# Patient Record
Sex: Female | Born: 1985 | Race: Black or African American | Hispanic: No | Marital: Single | Smoking: Never smoker
Health system: Southern US, Community
[De-identification: ages and names within clinical notes are randomized; demographics above are authoritative.]

## PROBLEM LIST (undated history)

## (undated) DIAGNOSIS — G43009 Migraine without aura, not intractable, without status migrainosus: Secondary | ICD-10-CM

---
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
## 2006-08-29 ENCOUNTER — Emergency Department (HOSPITAL_COMMUNITY): Admission: EM | Admit: 2006-08-29 | Discharge: 2006-08-29 | Payer: Self-pay | Admitting: Emergency Medicine

## 2009-06-20 ENCOUNTER — Emergency Department (HOSPITAL_COMMUNITY): Admission: EM | Admit: 2009-06-20 | Discharge: 2009-06-20 | Payer: Self-pay | Admitting: Emergency Medicine

## 2010-09-06 ENCOUNTER — Inpatient Hospital Stay (HOSPITAL_COMMUNITY)
Admission: AD | Admit: 2010-09-06 | Discharge: 2010-09-09 | Payer: Self-pay | Source: Home / Self Care | Attending: Obstetrics and Gynecology | Admitting: Obstetrics and Gynecology

## 2010-11-22 LAB — CBC
HCT: 30.5 % — ABNORMAL LOW (ref 36.0–46.0)
HCT: 37.2 % (ref 36.0–46.0)
Hemoglobin: 13.1 g/dL (ref 12.0–15.0)
MCH: 32.4 pg (ref 26.0–34.0)
MCHC: 34.4 g/dL (ref 30.0–36.0)
MCHC: 35.2 g/dL (ref 30.0–36.0)
Platelets: 209 10*3/uL (ref 150–400)
RBC: 4.02 MIL/uL (ref 3.87–5.11)
RDW: 13 % (ref 11.5–15.5)
WBC: 11.6 10*3/uL — ABNORMAL HIGH (ref 4.0–10.5)

## 2011-05-11 ENCOUNTER — Emergency Department (HOSPITAL_COMMUNITY)
Admission: EM | Admit: 2011-05-11 | Discharge: 2011-05-11 | Discharge status: Against Medical Advice | Payer: Medicaid Other | Attending: Emergency Medicine | Admitting: Emergency Medicine

## 2011-05-11 DIAGNOSIS — Z0489 Encounter for examination and observation for other specified reasons: Secondary | ICD-10-CM | POA: Insufficient documentation

## 2014-12-22 ENCOUNTER — Ambulatory Visit (INDEPENDENT_AMBULATORY_CARE_PROVIDER_SITE_OTHER): Payer: 59 | Admitting: Urgent Care

## 2014-12-22 ENCOUNTER — Encounter: Payer: Self-pay | Admitting: Urgent Care

## 2014-12-22 ENCOUNTER — Telehealth: Payer: Self-pay

## 2014-12-22 ENCOUNTER — Ambulatory Visit: Payer: Self-pay | Admitting: Urgent Care

## 2014-12-22 ENCOUNTER — Ambulatory Visit: Payer: Self-pay | Admitting: Family Medicine

## 2014-12-22 VITALS — BP 121/73 | HR 89 | Temp 98.2°F | Resp 16 | Ht 66.0 in | Wt 166.0 lb

## 2014-12-22 DIAGNOSIS — L309 Dermatitis, unspecified: Secondary | ICD-10-CM

## 2014-12-22 DIAGNOSIS — M79641 Pain in right hand: Secondary | ICD-10-CM

## 2014-12-22 MED ORDER — TRIAMCINOLONE 0.1 % CREAM:EUCERIN CREAM 1:1: 1.0000 "application " | TOPICAL_CREAM | Freq: Two times a day (BID) | CUTANEOUS | Status: DC

## 2014-12-22 MED ORDER — TRIAMCINOLONE ACETONIDE 0.1 % EX OINT: 1.0000 "application " | TOPICAL_OINTMENT | Freq: Two times a day (BID) | CUTANEOUS | Status: DC

## 2014-12-22 MED ORDER — LIDOCAINE (ANORECTAL) 5 % EX CREA: 1.0000 "application " | TOPICAL_CREAM | Freq: Two times a day (BID) | CUTANEOUS | Status: DC | PRN

## 2014-12-22 NOTE — Telephone Encounter (Signed)
Called patient's pharmacy. They confirmed that she will be getting triamcinilone ointment for eczema. Called patient and let her know.  Wallis BambergMario Bryelle Spiewak, PA-C [facility] [phone] 12/22/2014  6:05 PM

## 2014-12-22 NOTE — Patient Instructions (Signed)
Eczema Eczema, also called atopic dermatitis, is a skin disorder that causes inflammation of the skin. It causes a red rash and dry, scaly skin. The skin becomes very itchy. Eczema is generally worse during the cooler winter months and often improves with the warmth of summer. Eczema usually starts showing signs in infancy. Some children outgrow eczema, but it may last through adulthood.  CAUSES  The exact cause of eczema is not known, but it appears to run in families. People with eczema often have a family history of eczema, allergies, asthma, or hay fever. Eczema is not contagious. Flare-ups of the condition may be caused by:   Contact with something you are sensitive or allergic to.   Stress. SIGNS AND SYMPTOMS  Dry, scaly skin.   Red, itchy rash.   Itchiness. This may occur before the skin rash and may be very intense.  DIAGNOSIS  The diagnosis of eczema is usually made based on symptoms and medical history. TREATMENT  Eczema cannot be cured, but symptoms usually can be controlled with treatment and other strategies. A treatment plan might include:  Controlling the itching and scratching.   Use over-the-counter antihistamines as directed for itching. This is especially useful at night when the itching tends to be worse.   Use over-the-counter steroid creams as directed for itching.   Avoid scratching. Scratching makes the rash and itching worse. It may also result in a skin infection (impetigo) due to a break in the skin caused by scratching.   Keeping the skin well moisturized with creams every day. This will seal in moisture and help prevent dryness. Lotions that contain alcohol and water should be avoided because they can dry the skin.   Limiting exposure to things that you are sensitive or allergic to (allergens).   Recognizing situations that cause stress.   Developing a plan to manage stress.  HOME CARE INSTRUCTIONS   Only take over-the-counter or  prescription medicines as directed by your health care provider.   Do not use anything on the skin without checking with your health care provider.   Keep baths or showers short (5 minutes) in warm (not hot) water. Use mild cleansers for bathing. These should be unscented. You may add nonperfumed bath oil to the bath water. It is best to avoid soap and bubble bath.   Immediately after a bath or shower, when the skin is still damp, apply a moisturizing ointment to the entire body. This ointment should be a petroleum ointment. This will seal in moisture and help prevent dryness. The thicker the ointment, the better. These should be unscented.   Keep fingernails cut short. Children with eczema may need to wear soft gloves or mittens at night after applying an ointment.   Dress in clothes made of cotton or cotton blends. Dress lightly, because heat increases itching.   A child with eczema should stay away from anyone with fever blisters or cold sores. The virus that causes fever blisters (herpes simplex) can cause a serious skin infection in children with eczema. SEEK MEDICAL CARE IF:   Your itching interferes with sleep.   Your rash gets worse or is not better within 1 week after starting treatment.   You see pus or soft yellow scabs in the rash area.   You have a fever.   You have a rash flare-up after contact with someone who has fever blisters.  Document Released: 08/26/2000 Document Revised: 06/19/2013 Document Reviewed: 04/01/2013 [employer] Patient Information 2015 [employer]. This information   is not intended to replace advice given to you by your health care provider. Make sure you discuss any questions you have with your health care provider.  

## 2014-12-22 NOTE — Telephone Encounter (Signed)
Patient states she was to get an onitment not the cream.    [phone]

## 2014-12-22 NOTE — Progress Notes (Signed)
    MRN: [id number] DOB: 06/16/1986  Subjective:   Penny Kelly is a 29 y.o. female presenting for chief complaint of Eczema  Reports ~1 week history of dry, scaly skin rash over right forearm. Has an extensive history of intermittent eczema. Usually this has showed up in her right lower leg over extensor surface. Has used triamcinolone with significant relief in the past. Would like a refill of this medication. 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 Washes hands up to forearms frequently. Has a 1-2 year history of bilateral hand pain R>>L, hand tingling in right hand, feels cramping sensation in right hand as well. Now is having this in her forearm as well. She has previously tried lidocaine 5% with significant relief. Denies weakness, fevers, bony pain, loss of ROM, previous hand surgery, history of diabetes, history of carpal tunnel syndrome. Denies any other aggravating or relieving factors, no other questions or concerns.  Penny Kelly currently has no medications in their medication list. She has No Known Allergies.  Penny Kelly  has no past medical history on file. Also  has no past surgical history on file.  ROS As in subjective.  Objective:   Vitals: BP 121/73 mmHg  Pulse 89  Temp(Src) 98.2 F (36.8 C) (Oral)  Resp 16  Ht 5\' 6"  (1.676 m)  Wt 166 lb (75.297 kg)  BMI 26.81 kg/m2  SpO2 99%  LMP 12/15/2014  Physical Exam  Constitutional: She is well-developed, well-nourished, and in no distress.  Cardiovascular: Normal rate.   Pulmonary/Chest: Effort normal.  Musculoskeletal:       Right elbow: She exhibits normal range of motion, no swelling, no effusion, no deformity and no laceration. No tenderness found.       Right wrist: She exhibits normal range of motion, no tenderness, no bony tenderness, no swelling, no effusion, no crepitus, no deformity and no laceration.  Negative Tinnels and Phalens.  Skin: Skin is warm and dry. Rash  noted. No erythema. No pallor.      Assessment and Plan :   1. Eczema - Refilled triamcinolone ointment for patient, advised to apply pea-size amount to affected area twice daily for 1 week, return to clinic if no improvement in rash, consider infectious etiology  2. Pain of right hand - Refilled lidocaine 5%, counseled patient that this may be carpal tunnel syndrome and should undergo trial of wrist splint at night and/or during work, advised RICE, patient will continue to work, consider referral to PT or ortho for further evaluation if patient does not experience improvement in symptoms in 2-4 weeks. Patient agreed.  Wallis BambergMario Mathis Cashman, PA-C [facility] [phone] 12/22/2014 2:59 PM

## 2015-04-23 ENCOUNTER — Encounter (HOSPITAL_BASED_OUTPATIENT_CLINIC_OR_DEPARTMENT_OTHER): Payer: Self-pay | Admitting: *Deleted

## 2015-04-23 ENCOUNTER — Ambulatory Visit (INDEPENDENT_AMBULATORY_CARE_PROVIDER_SITE_OTHER): Payer: 59

## 2015-04-23 ENCOUNTER — Ambulatory Visit (INDEPENDENT_AMBULATORY_CARE_PROVIDER_SITE_OTHER): Payer: 59 | Admitting: Family Medicine

## 2015-04-23 VITALS — BP 128/70 | HR 64 | Temp 98.1°F | Resp 15 | Ht 65.5 in | Wt 158.0 lb

## 2015-04-23 DIAGNOSIS — Y998 Other external cause status: Secondary | ICD-10-CM | POA: Insufficient documentation

## 2015-04-23 DIAGNOSIS — S0990XA Unspecified injury of head, initial encounter: Secondary | ICD-10-CM | POA: Insufficient documentation

## 2015-04-23 DIAGNOSIS — S060X0A Concussion without loss of consciousness, initial encounter: Secondary | ICD-10-CM | POA: Diagnosis not present

## 2015-04-23 DIAGNOSIS — Y9389 Activity, other specified: Secondary | ICD-10-CM | POA: Insufficient documentation

## 2015-04-23 DIAGNOSIS — G44319 Acute post-traumatic headache, not intractable: Secondary | ICD-10-CM | POA: Diagnosis not present

## 2015-04-23 DIAGNOSIS — Y9289 Other specified places as the place of occurrence of the external cause: Secondary | ICD-10-CM | POA: Insufficient documentation

## 2015-04-23 DIAGNOSIS — S161XXA Strain of muscle, fascia and tendon at neck level, initial encounter: Secondary | ICD-10-CM | POA: Diagnosis not present

## 2015-04-23 DIAGNOSIS — M542 Cervicalgia: Secondary | ICD-10-CM | POA: Diagnosis not present

## 2015-04-23 DIAGNOSIS — W1839XA Other fall on same level, initial encounter: Secondary | ICD-10-CM | POA: Diagnosis not present

## 2015-04-23 NOTE — ED Notes (Addendum)
Another person fell on top of her head with his elbow at [facility] yesterday.  She was seen this am at [facility]. States her pain is getting worse. She feels she needs a CT. Alert oriented. She drove herself here. She is eating without vomiting.

## 2015-04-23 NOTE — Patient Instructions (Addendum)
Your x-rays of your neck appear overall okay, do not see any fracture. I suspect you strained the muscles at the base of the head and neck. See information below on this condition. If your pain is worsening  - recommend repeat evaluation here or the emergency room to determine if further imaging needed.  Based on your exam today, I do not see an acute need for CT scan of the brain. However if your headache worsens or any new or worsening symptoms as we discussed, proceed to the emergency room to determine if CT scanning needed that time. See precautions below. For medications at this time I would recommend Tylenol only as other medicines may cause side effects that could interfere with monitoring for worsening signs of concussion.    You can apply cool compress or heat to the area of soreness on your back , and neck as needed.  Return to the clinic or go to the nearest emergency room if any of your symptoms worsen or new symptoms occur.   HEAD INJURY If any of the following occur notify your physician or go to the Hospital Emergency Department: . Increased drowsiness, stupor or loss of consciousness . Restlessness or convulsions (fits) . Paralysis in arms or legs . Temperature above 100 F . Vomiting . Severe headache . Blood or clear fluid dripping from the nose or ears . Stiffness of the neck . Dizziness or blurred vision . Pulsating pain in the eye . Unequal pupils of eye . Personality changes . Any other unusual symptoms PRECAUTIONS . Keep head elevated at all times for the first 24 hours (Elevate mattress if pillow is ineffective) . Do not take tranquilizers, sedatives, narcotics or alcohol . Avoid aspirin. Use only acetaminophen (e.g. Tylenol) or ibuprofen (e.g. Advil) for relief of pain. Follow directions on the bottle for dosage. . Use ice packs for comfort MEDICATIONS Use medications only as directed by your physician  Concussion A concussion, or closed-head injury, is a  brain injury caused by a direct blow to the head or by a quick and sudden movement (jolt) of the head or neck. Concussions are usually not life-threatening. Even so, the effects of a concussion can be serious. If you have had a concussion before, you are more likely to experience concussion-like symptoms after a direct blow to the head.  CAUSES  Direct blow to the head, such as from running into another player during a soccer game, being hit in a fight, or hitting your head on a hard surface.  A jolt of the head or neck that causes the brain to move back and forth inside the skull, such as in a car crash. SIGNS AND SYMPTOMS The signs of a concussion can be hard to notice. Early on, they may be missed by you, family members, and health care providers. You may look fine but act or feel differently. Symptoms are usually temporary, but they may last for days, weeks, or even longer. Some symptoms may appear right away while others may not show up for hours or days. Every head injury is different. Symptoms include:  Mild to moderate headaches that will not go away.  A feeling of pressure inside your head.  Having more trouble than usual:  Learning or remembering things you have heard.  Answering questions.  Paying attention or concentrating.  Organizing daily tasks.  Making decisions and solving problems.  Slowness in thinking, acting or reacting, speaking, or reading.  Getting lost or being easily confused.  Feeling  tired all the time or lacking energy (fatigued).  Feeling drowsy.  Sleep disturbances.  Sleeping more than usual.  Sleeping less than usual.  Trouble falling asleep.  Trouble sleeping (insomnia).  Loss of balance or feeling lightheaded or dizzy.  Nausea or vomiting.  Numbness or tingling.  Increased sensitivity to:  Sounds.  Lights.  Distractions.  Vision problems or eyes that tire easily.  Diminished sense of taste or smell.  Ringing in the  ears.  Mood changes such as feeling sad or anxious.  Becoming easily irritated or angry for little or no reason.  Lack of motivation.  Seeing or hearing things other people do not see or hear (hallucinations). DIAGNOSIS Your health care provider can usually diagnose a concussion based on a description of your injury and symptoms. He or she will ask whether you passed out (lost consciousness) and whether you are having trouble remembering events that happened right before and during your injury. Your evaluation might include:  A brain scan to look for signs of injury to the brain. Even if the test shows no injury, you may still have a concussion.  Blood tests to be sure other problems are not present. TREATMENT  Concussions are usually treated in an emergency department, in urgent care, or at a clinic. You may need to stay in the hospital overnight for further treatment.  Tell your health care provider if you are taking any medicines, including prescription medicines, over-the-counter medicines, and natural remedies. Some medicines, such as blood thinners (anticoagulants) and aspirin, may increase the chance of complications. Also tell your health care provider whether you have had alcohol or are taking illegal drugs. This information may affect treatment.  Your health care provider will send you home with important instructions to follow.  How fast you will recover from a concussion depends on many factors. These factors include how severe your concussion is, what part of your brain was injured, your age, and how healthy you were before the concussion.  Most people with mild injuries recover fully. Recovery can take time. In general, recovery is slower in older persons. Also, persons who have had a concussion in the past or have other medical problems may find that it takes longer to recover from their current injury. HOME CARE INSTRUCTIONS General Instructions  Carefully follow the  directions your health care provider gave you.  Only take over-the-counter or prescription medicines for pain, discomfort, or fever as directed by your health care provider.  Take only those medicines that your health care provider has approved.  Do not drink alcohol until your health care provider says you are well enough to do so. Alcohol and certain other drugs may slow your recovery and can put you at risk of further injury.  If it is harder than usual to remember things, write them down.  If you are easily distracted, try to do one thing at a time. For example, do not try to watch TV while fixing dinner.  Talk with family members or close friends when making important decisions.  Keep all follow-up appointments. Repeated evaluation of your symptoms is recommended for your recovery.  Watch your symptoms and tell others to do the same. Complications sometimes occur after a concussion. Older adults with a brain injury may have a higher risk of serious complications, such as a blood clot on the brain.  Tell your teachers, school nurse, school counselor, coach, athletic trainer, or work Production designer, theatre/television/film about your injury, symptoms, and restrictions. Tell them about what  you can or cannot do. They should watch for:  Increased problems with attention or concentration.  Increased difficulty remembering or learning new information.  Increased time needed to complete tasks or assignments.  Increased irritability or decreased ability to cope with stress.  Increased symptoms.  Rest. Rest helps the brain to heal. Make sure you:  Get plenty of sleep at night. Avoid staying up late at night.  Keep the same bedtime hours on weekends and weekdays.  Rest during the day. Take daytime naps or rest breaks when you feel tired.  Limit activities that require a lot of thought or concentration. These include:  Doing homework or job-related work.  Watching TV.  Working on the computer.  Avoid any  situation where there is potential for another head injury (football, hockey, soccer, basketball, martial arts, downhill snow sports and horseback riding). Your condition will get worse every time you experience a concussion. You should avoid these activities until you are evaluated by the appropriate follow-up health care providers. Returning To Your Regular Activities You will need to return to your normal activities slowly, not all at once. You must give your body and brain enough time for recovery.  Do not return to sports or other athletic activities until your health care provider tells you it is safe to do so.  Ask your health care provider when you can drive, ride a bicycle, or operate heavy machinery. Your ability to react may be slower after a brain injury. Never do these activities if you are dizzy.  Ask your health care provider about when you can return to work or school. Preventing Another Concussion It is very important to avoid another brain injury, especially before you have recovered. In rare cases, another injury can lead to permanent brain damage, brain swelling, or death. The risk of this is greatest during the first 7-10 days after a head injury. Avoid injuries by:  Wearing a seat belt when riding in a car.  Drinking alcohol only in moderation.  Wearing a helmet when biking, skiing, skateboarding, skating, or doing similar activities.  Avoiding activities that could lead to a second concussion, such as contact or recreational sports, until your health care provider says it is okay.  Taking safety measures in your home.  Remove clutter and tripping hazards from floors and stairways.  Use grab bars in bathrooms and handrails by stairs.  Place non-slip mats on floors and in bathtubs.  Improve lighting in dim areas. SEEK MEDICAL CARE IF:  You have increased problems paying attention or concentrating.  You have increased difficulty remembering or learning new  information.  You need more time to complete tasks or assignments than before.  You have increased irritability or decreased ability to cope with stress.  You have more symptoms than before. Seek medical care if you have any of the following symptoms for more than 2 weeks after your injury:  Lasting (chronic) headaches.  Dizziness or balance problems.  Nausea.  Vision problems.  Increased sensitivity to noise or light.  Depression or mood swings.  Anxiety or irritability.  Memory problems.  Difficulty concentrating or paying attention.  Sleep problems.  Feeling tired all the time. SEEK IMMEDIATE MEDICAL CARE IF:  You have severe or worsening headaches. These may be a sign of a blood clot in the brain.  You have weakness (even if only in one hand, leg, or part of the face).  You have numbness.  You have decreased coordination.  You vomit repeatedly.  You have increased sleepiness.  One pupil is larger than the other.  You have convulsions.  You have slurred speech.  You have increased confusion. This may be a sign of a blood clot in the brain.  You have increased restlessness, agitation, or irritability.  You are unable to recognize people or places.  You have neck pain.  It is difficult to wake you up.  You have unusual behavior changes.  You lose consciousness. MAKE SURE YOU:  Understand these instructions.  Will watch your condition.  Will get help right away if you are not doing well or get worse. Document Released: 11/19/2003 Document Revised: 09/03/2013 Document Reviewed: 03/21/2013 [facility] Patient Information 2015 [place], [place]. This information is not intended to replace advice given to you by your health care provider. Make sure you discuss any questions you have with your health care provider.  Cervical Sprain A cervical sprain is an injury in the neck in which the strong, fibrous tissues (ligaments) that connect your neck bones  stretch or tear. Cervical sprains can range from mild to severe. Severe cervical sprains can cause the neck vertebrae to be unstable. This can lead to damage of the spinal cord and can result in serious nervous system problems. The amount of time it takes for a cervical sprain to get better depends on the cause and extent of the injury. Most cervical sprains heal in 1 to 3 weeks. CAUSES  Severe cervical sprains may be caused by:   Contact sport injuries (such as from football, rugby, wrestling, hockey, auto racing, gymnastics, diving, martial arts, or boxing).   Motor vehicle collisions.   Whiplash injuries. This is an injury from a sudden forward and backward whipping movement of the head and neck.  Falls.  Mild cervical sprains may be caused by:   Being in an awkward position, such as while cradling a telephone between your ear and shoulder.   Sitting in a chair that does not offer proper support.   Working at a poorly Marketing executive station.   Looking up or down for long periods of time.  SYMPTOMS   Pain, soreness, stiffness, or a burning sensation in the front, back, or sides of the neck. This discomfort may develop immediately after the injury or slowly, 24 hours or more after the injury.   Pain or tenderness directly in the middle of the back of the neck.   Shoulder or upper back pain.   Limited ability to move the neck.   Headache.   Dizziness.   Weakness, numbness, or tingling in the hands or arms.   Muscle spasms.   Difficulty swallowing or chewing.   Tenderness and swelling of the neck.  DIAGNOSIS  Most of the time your health care provider can diagnose a cervical sprain by taking your history and doing a physical exam. Your health care provider will ask about previous neck injuries and any known neck problems, such as arthritis in the neck. X-rays may be taken to find out if there are any other problems, such as with the bones of the neck.  Other tests, such as a CT scan or MRI, may also be needed.  TREATMENT  Treatment depends on the severity of the cervical sprain. Mild sprains can be treated with rest, keeping the neck in place (immobilization), and pain medicines. Severe cervical sprains are immediately immobilized. Further treatment is done to help with pain, muscle spasms, and other symptoms and may include:  Medicines, such as pain relievers, numbing medicines,  or muscle relaxants.   Physical therapy. This may involve stretching exercises, strengthening exercises, and posture training. Exercises and improved posture can help stabilize the neck, strengthen muscles, and help stop symptoms from returning.  HOME CARE INSTRUCTIONS   Put ice on the injured area.   Put ice in a plastic bag.   Place a towel between your skin and the bag.   Leave the ice on for 15-20 minutes, 3-4 times a day.   If your injury was severe, you may have been given a cervical collar to wear. A cervical collar is a two-piece collar designed to keep your neck from moving while it heals.  Do not remove the collar unless instructed by your health care provider.  If you have long hair, keep it outside of the collar.  Ask your health care provider before making any adjustments to your collar. Minor adjustments may be required over time to improve comfort and reduce pressure on your chin or on the back of your head.  Ifyou are allowed to remove the collar for cleaning or bathing, follow your health care provider's instructions on how to do so safely.  Keep your collar clean by wiping it with mild soap and water and drying it completely. If the collar you have been given includes removable pads, remove them every 1-2 days and hand wash them with soap and water. Allow them to air dry. They should be completely dry before you wear them in the collar.  If you are allowed to remove the collar for cleaning and bathing, wash and dry the skin of your  neck. Check your skin for irritation or sores. If you see any, tell your health care provider.  Do not drive while wearing the collar.   Only take over-the-counter or prescription medicines for pain, discomfort, or fever as directed by your health care provider.   Keep all follow-up appointments as directed by your health care provider.   Keep all physical therapy appointments as directed by your health care provider.   Make any needed adjustments to your workstation to promote good posture.   Avoid positions and activities that make your symptoms worse.   Warm up and stretch before being active to help prevent problems.  SEEK MEDICAL CARE IF:   Your pain is not controlled with medicine.   You are unable to decrease your pain medicine over time as planned.   Your activity level is not improving as expected.  SEEK IMMEDIATE MEDICAL CARE IF:   You develop any bleeding.  You develop stomach upset.  You have signs of an allergic reaction to your medicine.   Your symptoms get worse.   You develop new, unexplained symptoms.   You have numbness, tingling, weakness, or paralysis in any part of your body.  MAKE SURE YOU:   Understand these instructions.  Will watch your condition.  Will get help right away if you are not doing well or get worse. Document Released: 06/26/2007 Document Revised: 09/03/2013 Document Reviewed: 03/06/2013 [facility] Patient Information 2015 [place], [place]. This information is not intended to replace advice given to you by your health care provider. Make sure you discuss any questions you have with your health care provider.
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
## 2015-04-24 ENCOUNTER — Emergency Department (HOSPITAL_BASED_OUTPATIENT_CLINIC_OR_DEPARTMENT_OTHER)
Admission: EM | Admit: 2015-04-24 | Discharge: 2015-04-24 | Discharge status: Against Medical Advice | Payer: 59 | Attending: Emergency Medicine | Admitting: Emergency Medicine

## 2015-04-24 ENCOUNTER — Telehealth: Payer: Self-pay

## 2015-04-24 ENCOUNTER — Encounter (HOSPITAL_COMMUNITY): Payer: Self-pay | Admitting: *Deleted

## 2015-04-24 DIAGNOSIS — R42 Dizziness and giddiness: Secondary | ICD-10-CM | POA: Diagnosis not present

## 2015-04-24 DIAGNOSIS — R51 Headache: Secondary | ICD-10-CM | POA: Insufficient documentation

## 2015-04-24 DIAGNOSIS — R110 Nausea: Secondary | ICD-10-CM | POA: Diagnosis not present

## 2015-04-24 DIAGNOSIS — Z8679 Personal history of other diseases of the circulatory system: Secondary | ICD-10-CM | POA: Diagnosis not present
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
## 2015-04-24 NOTE — Telephone Encounter (Signed)
Penny Kelly spoke with Dr. Neva Seat on yesterday about a CT scan. She wants him to go ahead and order that for her please & get back in contact with her with all the details.

## 2015-04-25 ENCOUNTER — Emergency Department (HOSPITAL_COMMUNITY)
Admission: EM | Admit: 2015-04-25 | Discharge: 2015-04-25 | Disposition: A | Discharge status: Home / Self Care | Payer: 59 | Attending: Emergency Medicine | Admitting: Emergency Medicine

## 2015-04-25 ENCOUNTER — Emergency Department (HOSPITAL_COMMUNITY): Payer: 59

## 2015-04-25 DIAGNOSIS — R519 Headache, unspecified: Secondary | ICD-10-CM

## 2015-04-25 DIAGNOSIS — R51 Headache: Secondary | ICD-10-CM

## 2015-04-25 HISTORY — DX: Migraine without aura, not intractable, without status migrainosus: G43.009

## 2015-04-25 MED ORDER — METOCLOPRAMIDE HCL 5 MG/ML IJ SOLN
10.0000 mg | Freq: Once | INTRAMUSCULAR | Status: AC
  Administered 2015-04-25: 10 mg via INTRAVENOUS
  Filled 2015-04-25: qty 2

## 2015-04-25 MED ORDER — DIPHENHYDRAMINE HCL 50 MG/ML IJ SOLN
25.0000 mg | Freq: Once | INTRAMUSCULAR | Status: AC
  Administered 2015-04-25: 25 mg via INTRAVENOUS
  Filled 2015-04-25: qty 1

## 2015-04-25 MED ORDER — KETOROLAC TROMETHAMINE 30 MG/ML IJ SOLN
30.0000 mg | Freq: Once | INTRAMUSCULAR | Status: AC
  Administered 2015-04-25: 30 mg via INTRAVENOUS
  Filled 2015-04-25: qty 1

## 2015-04-25 MED ORDER — METOCLOPRAMIDE HCL 10 MG PO TABS: 10.0000 mg | ORAL_TABLET | Freq: Four times a day (QID) | ORAL | Status: DC | PRN

## 2015-04-25 MED ORDER — SODIUM CHLORIDE 0.9 % IV SOLN
1000.0000 mL | Freq: Once | INTRAVENOUS | Status: AC
  Administered 2015-04-25: 1000 mL via INTRAVENOUS

## 2015-04-25 MED ORDER — SODIUM CHLORIDE 0.9 % IV SOLN
1000.0000 mL | INTRAVENOUS | Status: DC
  Administered 2015-04-25: 1000 mL via INTRAVENOUS

## 2015-04-25 NOTE — Discharge Instructions (Signed)
Take ibuprofen, naproxen, or acetaminophen as needed for pain.  General Headache Without Cause A headache is pain or discomfort felt around the head or neck area. The specific cause of a headache may not be found. There are many causes and types of headaches. A few common ones are:  Tension headaches.  Migraine headaches.  Cluster headaches.  Chronic daily headaches. HOME CARE INSTRUCTIONS   Keep all follow-up appointments with your caregiver or any specialist referral.  Only take over-the-counter or prescription medicines for pain or discomfort as directed by your caregiver.  Lie down in a dark, quiet room when you have a headache.  Keep a headache journal to find out what may trigger your migraine headaches. For example, write down:  What you eat and drink.  How much sleep you get.  Any change to your diet or medicines.  Try massage or other relaxation techniques.  Put ice packs or heat on the head and neck. Use these 3 to 4 times per day for 15 to 20 minutes each time, or as needed.  Limit stress.  Sit up straight, and do not tense your muscles.  Quit smoking if you smoke.  Limit alcohol use.  Decrease the amount of caffeine you drink, or stop drinking caffeine.  Eat and sleep on a regular schedule.  Get 7 to 9 hours of sleep, or as recommended by your caregiver.  Keep lights dim if bright lights bother you and make your headaches worse. SEEK MEDICAL CARE IF:   You have problems with the medicines you were prescribed.  Your medicines are not working.  You have a change from the usual headache.  You have nausea or vomiting. SEEK IMMEDIATE MEDICAL CARE IF:   Your headache becomes severe.  You have a fever.  You have a stiff neck.  You have loss of vision.  You have muscular weakness or loss of muscle control.  You start losing your balance or have trouble walking.  You feel faint or pass out.  You have severe symptoms that are different from  your first symptoms. MAKE SURE YOU:   Understand these instructions.  Will watch your condition.  Will get help right away if you are not doing well or get worse. Document Released: 08/29/2005 Document Revised: 11/21/2011 Document Reviewed: 09/14/2011 [facility] Patient Information 2015 [place], [place]. This information is not intended to replace advice given to you by your health care provider. Make sure you discuss any questions you have with your health care provider.  Metoclopramide tablets What is this medicine? METOCLOPRAMIDE (met oh kloe PRA mide) is used to treat the symptoms of gastroesophageal reflux disease (GERD) like heartburn. It is also used to treat people with slow emptying of the stomach and intestinal tract. This medicine may be used for other purposes; ask your health care provider or pharmacist if you have questions. COMMON BRAND NAME(S): Reglan What should I tell my health care provider before I take this medicine? They need to know if you have any of these conditions: -breast cancer -depression -diabetes -heart failure -high blood pressure -kidney disease -liver disease -Parkinson's disease or a movement disorder -pheochromocytoma -seizures -stomach obstruction, bleeding, or perforation -an unusual or allergic reaction to metoclopramide, procainamide, sulfites, other medicines, foods, dyes, or preservatives -pregnant or trying to get pregnant -breast-feeding How should I use this medicine? Take this medicine by mouth with a glass of water. Follow the directions on the prescription label. Take this medicine on an empty stomach, about 30 minutes before  eating. Take your doses at regular intervals. Do not take your medicine more often than directed. Do not stop taking except on the advice of your doctor or health care professional. A special MedGuide will be given to you by the pharmacist with each prescription and refill. Be sure to read this information carefully  each time. Talk to your pediatrician regarding the use of this medicine in children. Special care may be needed. Overdosage: If you think you have taken too much of this medicine contact a poison control center or emergency room at once. NOTE: This medicine is only for you. Do not share this medicine with others. What if I miss a dose? If you miss a dose, take it as soon as you can. If it is almost time for your next dose, take only that dose. Do not take double or extra doses. What may interact with this medicine? -acetaminophen -cyclosporine -digoxin -medicines for blood pressure -medicines for diabetes, including insulin -medicines for hay fever and other allergies -medicines for depression, especially an Monoamine Oxidase Inhibitor (MAOI) -medicines for Parkinson's disease, like levodopa -medicines for sleep or for pain -tetracycline This list may not describe all possible interactions. Give your health care provider a list of all the medicines, herbs, non-prescription drugs, or dietary supplements you use. Also tell them if you smoke, drink alcohol, or use illegal drugs. Some items may interact with your medicine. What should I watch for while using this medicine? It may take a few weeks for your stomach condition to start to get better. However, do not take this medicine for longer than 12 weeks. The longer you take this medicine, and the more you take it, the greater your chances are of developing serious side effects. If you are an elderly patient, a female patient, or you have diabetes, you may be at an increased risk for side effects from this medicine. Contact your doctor immediately if you start having movements you cannot control such as lip smacking, rapid movements of the tongue, involuntary or uncontrollable movements of the eyes, head, arms and legs, or muscle twitches and spasms. Patients and their families should watch out for worsening depression or thoughts of suicide. Also  watch out for any sudden or severe changes in feelings such as feeling anxious, agitated, panicky, irritable, hostile, aggressive, impulsive, severely restless, overly excited and hyperactive, or not being able to sleep. If this happens, especially at the beginning of treatment or after a change in dose, call your doctor. Do not treat yourself for high fever. Ask your doctor or health care professional for advice. You may get drowsy or dizzy. Do not drive, use machinery, or do anything that needs mental alertness until you know how this drug affects you. Do not stand or sit up quickly, especially if you are an older patient. This reduces the risk of dizzy or fainting spells. Alcohol can make you more drowsy and dizzy. Avoid alcoholic drinks. What side effects may I notice from receiving this medicine? Side effects that you should report to your doctor or health care professional as soon as possible: -allergic reactions like skin rash, itching or hives, swelling of the face, lips, or tongue -abnormal production of milk in females -breast enlargement in both males and females -change in the way you walk -difficulty moving, speaking or swallowing -drooling, lip smacking, or rapid movements of the tongue -excessive sweating -fever -involuntary or uncontrollable movements of the eyes, head, arms and legs -irregular heartbeat or palpitations -muscle twitches and spasms -  unusually weak or tired Side effects that usually do not require medical attention (report to your doctor or health care professional if they continue or are bothersome): -change in sex drive or performance -depressed mood -diarrhea -difficulty sleeping -headache -menstrual changes -restless or nervous This list may not describe all possible side effects. Call your doctor for medical advice about side effects. You may report side effects to FDA at 1-800-FDA-1088. Where should I keep my medicine? Keep out of the reach of  children. Store at room temperature between 20 and 25 degrees C (68 and 77 degrees F). Protect from light. Keep container tightly closed. Throw away any unused medicine after the expiration date. NOTE: This sheet is a summary. It may not cover all possible information. If you have questions about this medicine, talk to your doctor, pharmacist, or health care provider.  2015, [employer]. (2011-12-27 13:04:38)

## 2015-04-25 NOTE — ED Notes (Signed)
Patient returned from CT
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
## 2015-04-28 NOTE — Telephone Encounter (Signed)
Message received this morning. Noted she has been evaluated in ER, and subsequently had negative CT head and C spine.

## 2015-05-04 ENCOUNTER — Ambulatory Visit (INDEPENDENT_AMBULATORY_CARE_PROVIDER_SITE_OTHER): Payer: 59 | Admitting: Family Medicine

## 2015-05-04 ENCOUNTER — Encounter: Payer: Self-pay | Admitting: Family Medicine

## 2015-05-04 VITALS — BP 121/77 | HR 75 | Temp 98.8°F | Resp 16 | Ht 65.0 in | Wt 156.8 lb

## 2015-05-04 DIAGNOSIS — M546 Pain in thoracic spine: Secondary | ICD-10-CM | POA: Diagnosis not present

## 2015-05-04 DIAGNOSIS — R42 Dizziness and giddiness: Secondary | ICD-10-CM

## 2015-05-04 DIAGNOSIS — R29818 Other symptoms and signs involving the nervous system: Secondary | ICD-10-CM | POA: Diagnosis not present

## 2015-05-04 DIAGNOSIS — M6248 Contracture of muscle, other site: Secondary | ICD-10-CM | POA: Diagnosis not present

## 2015-05-04 DIAGNOSIS — M62838 Other muscle spasm: Secondary | ICD-10-CM

## 2015-05-04 DIAGNOSIS — G44309 Post-traumatic headache, unspecified, not intractable: Secondary | ICD-10-CM

## 2015-05-04 DIAGNOSIS — R2689 Other abnormalities of gait and mobility: Secondary | ICD-10-CM

## 2015-05-04 DIAGNOSIS — M545 Low back pain: Secondary | ICD-10-CM | POA: Diagnosis not present

## 2015-05-04 DIAGNOSIS — M62830 Muscle spasm of back: Secondary | ICD-10-CM

## 2015-05-04 DIAGNOSIS — M549 Dorsalgia, unspecified: Secondary | ICD-10-CM

## 2015-05-04 MED ORDER — CYCLOBENZAPRINE HCL 5 MG PO TABS: 2.5000 mg | ORAL_TABLET | Freq: Three times a day (TID) | ORAL | Status: DC | PRN

## 2015-05-04 NOTE — Progress Notes (Signed)
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 and use proper posture when sitting or lifting (maintain proper posture when sitting, lift using the knees and legs, not at the waist).  Warm up and stretch properly before activity.  Allow for adequate recovery between workouts.  Maintain physical fitness:  Strength, flexibility, and endurance.  Cardiovascular fitness. PROGNOSIS  If treated properly, lower back strains usually heal within 6 weeks. RELATED COMPLICATIONS   Recurring symptoms, resulting in a chronic problem.  Chronic inflammation, scarring, and partial muscle-tendon tear.  Delayed healing or resolution of symptoms.  Prolonged disability. TREATMENT  Treatment first involves the use of ice and medicine, to reduce pain and inflammation. The use of strengthening and stretching exercises may help reduce pain with  activity. These exercises may be performed at home or with a therapist. Severe injuries may require referral to a therapist for further evaluation and treatment, such as ultrasound. Your caregiver may advise that you wear a back brace or corset, to help reduce pain and discomfort. Often, prolonged bed rest results in greater harm then benefit. Corticosteroid injections may be recommended. However, these should be reserved for the most serious cases. It is important to avoid using your back when lifting objects. At night, sleep on your back on a firm mattress with a pillow placed under your knees. If non-surgical treatment is unsuccessful, surgery may be needed.  MEDICATION   If pain medicine is needed, nonsteroidal anti-inflammatory medicines (aspirin and ibuprofen), or other minor pain relievers (acetaminophen), are often advised.  Do not take pain medicine for 7 days before surgery.  Prescription pain relievers may be given, if your caregiver thinks they are needed. Use only as directed and only as much as you need.  Ointments applied to the skin may be helpful.  Corticosteroid injections may be given by your caregiver. These injections should be reserved for the most serious cases, because they may only be given a certain number of times. HEAT AND COLD  Cold treatment (icing) should be applied for 10 to 15 minutes every 2 to 3 hours for inflammation and pain, and immediately after activity that aggravates your symptoms. Use ice packs or an ice massage.  Heat treatment may be used before performing stretching and strengthening activities prescribed by your caregiver, physical therapist, or athletic trainer. Use a heat pack or a warm water soak. SEEK MEDICAL CARE IF:   Symptoms get worse or do not improve in 2 to 4 weeks, despite treatment.  You develop numbness, weakness, or loss of bowel or bladder function.  New, unexplained symptoms develop. (Drugs used in treatment may produce side  effects.) EXERCISES  RANGE OF MOTION (ROM) AND STRETCHING EXERCISES - Low Back Strain Most people with lower back pain will find that their symptoms get worse with excessive bending forward (flexion) or arching at the lower back (extension). The exercises which will help resolve your symptoms will focus on the opposite motion.  Your physician, physical therapist or athletic trainer will help you determine which exercises will be most helpful to resolve your lower back pain. Do not complete any exercises without first consulting with your caregiver.  Discontinue any exercises which make your symptoms worse until you speak to your caregiver.  If you have pain, numbness or tingling which travels down into your buttocks, leg or foot, the goal of the therapy is for these symptoms to move closer to your back and eventually resolve. Sometimes, these leg symptoms will get better, but your lower back pain may worsen. This is typically an indication of progress in your rehabilitation. Be very alert to any changes in your symptoms and the activities in which you participated in the 24 hours prior to the change. Sharing this information with your caregiver will allow him/her to most efficiently treat your condition.  These exercises may help you when beginning to rehabilitate your injury. Your symptoms may resolve with or without further involvement from your physician, physical therapist or athletic trainer. While completing these exercises, remember:  Restoring tissue flexibility helps normal motion to return to the joints. This allows healthier, less painful movement and activity.  An effective stretch should be held for at least 30 seconds.  A stretch should never be painful. You should only feel a gentle lengthening or release in the stretched tissue. FLEXION RANGE OF MOTION AND STRETCHING EXERCISES: STRETCH - Flexion, Single Knee to Chest   Lie on a firm bed or floor with both legs extended in front of  you.  Keeping one leg in contact with the floor, bring your opposite knee to your chest. Hold your leg in place by either grabbing behind your thigh or at your knee.  Pull until you feel a gentle stretch in your lower back. Hold __________ seconds.  Slowly release your grasp and repeat the exercise with the opposite side. Repeat __________ times. Complete this exercise __________ times per day.  STRETCH - Flexion, Double Knee to Chest   Lie on a firm bed or floor with both legs extended in front of you.  Keeping one leg in contact with the floor, bring your opposite knee to your chest.  Tense your stomach muscles to support your back and then lift your other knee to your chest. Hold your legs in place by either grabbing behind your thighs or at your knees.  Pull both knees toward your chest until you feel a gentle stretch in your lower back. Hold __________ seconds.  Tense your stomach muscles and slowly return one leg at a time to the floor. Repeat __________ times. Complete this exercise __________ times per day.  STRETCH - Low Trunk Rotation  Lie on a firm bed or floor. Keeping your legs in front of you, bend your knees so they are both pointed toward the ceiling and your feet are flat on the floor.  Extend your arms out to the side. This will stabilize your upper body by keeping your shoulders in contact with the floor.  Gently and slowly drop both knees together to one side until you feel a gentle stretch in your lower back. Hold for __________ seconds.  Tense your stomach muscles to support your lower back as you bring your knees back to the starting position. Repeat the exercise to the other side. Repeat __________ times. Complete this exercise __________ times per day  EXTENSION RANGE OF MOTION AND FLEXIBILITY EXERCISES: STRETCH - Extension, Prone on Elbows   Lie on your stomach on the floor, a bed will be too soft. Place your palms about shoulder width apart and at the  height of your head.  Place your elbows under your shoulders. If this is too painful,  stack pillows under your chest.  Allow your body to relax so that your hips drop lower and make contact more completely with the floor.  Hold this position for __________ seconds.  Slowly return to lying flat on the floor. Repeat __________ times. Complete this exercise __________ times per day.  RANGE OF MOTION - Extension, Prone Press Ups  Lie on your stomach on the floor, a bed will be too soft. Place your palms about shoulder width apart and at the height of your head.  Keeping your back as relaxed as possible, slowly straighten your elbows while keeping your hips on the floor. You may adjust the placement of your hands to maximize your comfort. As you gain motion, your hands will come more underneath your shoulders.  Hold this position __________ seconds.  Slowly return to lying flat on the floor. Repeat __________ times. Complete this exercise __________ times per day.  RANGE OF MOTION- Quadruped, Neutral Spine   Assume a hands and knees position on a firm surface. Keep your hands under your shoulders and your knees under your hips. You may place padding under your knees for comfort.  Drop your head and point your tail bone toward the ground below you. This will round out your lower back like an angry cat. Hold this position for __________ seconds.  Slowly lift your head and release your tail bone so that your back sags into a large arch, like an old horse.  Hold this position for __________ seconds.  Repeat this until you feel limber in your lower back.  Now, find your "sweet spot." This will be the most comfortable position somewhere between the two previous positions. This is your neutral spine. Once you have found this position, tense your stomach muscles to support your lower back.  Hold this position for __________ seconds. Repeat __________ times. Complete this exercise __________  times per day.  STRENGTHENING EXERCISES - Low Back Strain These exercises may help you when beginning to rehabilitate your injury. These exercises should be done near your "sweet spot." This is the neutral, low-back arch, somewhere between fully rounded and fully arched, that is your least painful position. When performed in this safe range of motion, these exercises can be used for people who have either a flexion or extension based injury. These exercises may resolve your symptoms with or without further involvement from your physician, physical therapist or athletic trainer. While completing these exercises, remember:   Muscles can gain both the endurance and the strength needed for everyday activities through controlled exercises.  Complete these exercises as instructed by your physician, physical therapist or athletic trainer. Increase the resistance and repetitions only as guided.  You may experience muscle soreness or fatigue, but the pain or discomfort you are trying to eliminate should never worsen during these exercises. If this pain does worsen, stop and make certain you are following the directions exactly. If the pain is still present after adjustments, discontinue the exercise until you can discuss the trouble with your caregiver. STRENGTHENING - Deep Abdominals, Pelvic Tilt  Lie on a firm bed or floor. Keeping your legs in front of you, bend your knees so they are both pointed toward the ceiling and your feet are flat on the floor.  Tense your lower abdominal muscles to press your lower back into the floor. This motion will rotate your pelvis so that your tail bone is scooping upwards rather than pointing at your feet or into the floor.  With a gentle  tension and even breathing, hold this position for __________ seconds. Repeat __________ times. Complete this exercise __________ times per day.  STRENGTHENING - Abdominals, Crunches   Lie on a firm bed or floor. Keeping your legs in  front of you, bend your knees so they are both pointed toward the ceiling and your feet are flat on the floor. Cross your arms over your chest.  Slightly tip your chin down without bending your neck.  Tense your abdominals and slowly lift your trunk high enough to just clear your shoulder blades. Lifting higher can put excessive stress on the lower back and does not further strengthen your abdominal muscles.  Control your return to the starting position. Repeat __________ times. Complete this exercise __________ times per day.  STRENGTHENING - Quadruped, Opposite UE/LE Lift   Assume a hands and knees position on a firm surface. Keep your hands under your shoulders and your knees under your hips. You may place padding under your knees for comfort.  Find your neutral spine and gently tense your abdominal muscles so that you can maintain this position. Your shoulders and hips should form a rectangle that is parallel with the floor and is not twisted.  Keeping your trunk steady, lift your right hand no higher than your shoulder and then your left leg no higher than your hip. Make sure you are not holding your breath. Hold this position __________ seconds.  Continuing to keep your abdominal muscles tense and your back steady, slowly return to your starting position. Repeat with the opposite arm and leg. Repeat __________ times. Complete this exercise __________ times per day.  STRENGTHENING - Lower Abdominals, Double Knee Lift  Lie on a firm bed or floor. Keeping your legs in front of you, bend your knees so they are both pointed toward the ceiling and your feet are flat on the floor.  Tense your abdominal muscles to brace your lower back and slowly lift both of your knees until they come over your hips. Be certain not to hold your breath.  Hold __________ seconds. Using your abdominal muscles, return to the starting position in a slow and controlled manner. Repeat __________ times. Complete this  exercise __________ times per day.  POSTURE AND BODY MECHANICS CONSIDERATIONS - Low Back Strain Keeping correct posture when sitting, standing or completing your activities will reduce the stress put on different body tissues, allowing injured tissues a chance to heal and limiting painful experiences. The following are general guidelines for improved posture. Your physician or physical therapist will provide you with any instructions specific to your needs. While reading these guidelines, remember:  The exercises prescribed by your provider will help you have the flexibility and strength to maintain correct postures.  The correct posture provides the best environment for your joints to work. All of your joints have less wear and tear when properly supported by a spine with good posture. This means you will experience a healthier, less painful body.  Correct posture must be practiced with all of your activities, especially prolonged sitting and standing. Correct posture is as important when doing repetitive low-stress activities (typing) as it is when doing a single heavy-load activity (lifting). RESTING POSITIONS Consider which positions are most painful for you when choosing a resting position. If you have pain with flexion-based activities (sitting, bending, stooping, squatting), choose a position that allows you to rest in a less flexed posture. You would want to avoid curling into a fetal position on your side. If your pain  worsens with extension-based activities (prolonged standing, working overhead), avoid resting in an extended position such as sleeping on your stomach. Most people will find more comfort when they rest with their spine in a more neutral position, neither too rounded nor too arched. Lying on a non-sagging bed on your side with a pillow between your knees, or on your back with a pillow under your knees will often provide some relief. Keep in mind, being in any one position for a  prolonged period of time, no matter how correct your posture, can still lead to stiffness. PROPER SITTING POSTURE In order to minimize stress and discomfort on your spine, you must sit with correct posture. Sitting with good posture should be effortless for a healthy body. Returning to good posture is a gradual process. Many people can work toward this most comfortably by using various supports until they have the flexibility and strength to maintain this posture on their own. When sitting with proper posture, your ears will fall over your shoulders and your shoulders will fall over your hips. You should use the back of the chair to support your upper back. Your lower back will be in a neutral position, just slightly arched. You may place a small pillow or folded towel at the base of your lower back for support.  When working at a desk, create an environment that supports good, upright posture. Without extra support, muscles tire, which leads to excessive strain on joints and other tissues. Keep these recommendations in mind: CHAIR:  A chair should be able to slide under your desk when your back makes contact with the back of the chair. This allows you to work closely.  The chair's height should allow your eyes to be level with the upper part of your monitor and your hands to be slightly lower than your elbows. BODY POSITION  Your feet should make contact with the floor. If this is not possible, use a foot rest.  Keep your ears over your shoulders. This will reduce stress on your neck and lower back. INCORRECT SITTING POSTURES  If you are feeling tired and unable to assume a healthy sitting posture, do not slouch or slump. This puts excessive strain on your back tissues, causing more damage and pain. Healthier options include:  Using more support, like a lumbar pillow.  Switching tasks to something that requires you to be upright or walking.  Talking a brief walk.  Lying down to rest in a  neutral-spine position. PROLONGED STANDING WHILE SLIGHTLY LEANING FORWARD  When completing a task that requires you to lean forward while standing in one place for a long time, place either foot up on a stationary 2-4 inch high object to help maintain the best posture. When both feet are on the ground, the lower back tends to lose its slight inward curve. If this curve flattens (or becomes too large), then the back and your other joints will experience too much stress, tire more quickly, and can cause pain. CORRECT STANDING POSTURES Proper standing posture should be assumed with all daily activities, even if they only take a few moments, like when brushing your teeth. As in sitting, your ears should fall over your shoulders and your shoulders should fall over your hips. You should keep a slight tension in your abdominal muscles to brace your spine. Your tailbone should point down to the ground, not behind your body, resulting in an over-extended swayback posture.  INCORRECT STANDING POSTURES  Common incorrect standing postures include  a forward head, locked knees and/or an excessive swayback. WALKING Walk with an upright posture. Your ears, shoulders and hips should all line-up. PROLONGED ACTIVITY IN A FLEXED POSITION When completing a task that requires you to bend forward at your waist or lean over a low surface, try to find a way to stabilize 3 out of 4 of your limbs. You can place a hand or elbow on your thigh or rest a knee on the surface you are reaching across. This will provide you more stability so that your muscles do not fatigue as quickly. By keeping your knees relaxed, or slightly bent, you will also reduce stress across your lower back. CORRECT LIFTING TECHNIQUES DO :   Assume a wide stance. This will provide you more stability and the opportunity to get as close as possible to the object which you are lifting.  Tense your abdominals to brace your spine. Bend at the knees and hips.  Keeping your back locked in a neutral-spine position, lift using your leg muscles. Lift with your legs, keeping your back straight.  Test the weight of unknown objects before attempting to lift them.  Try to keep your elbows locked down at your sides in order get the best strength from your shoulders when carrying an object.  Always ask for help when lifting heavy or awkward objects. INCORRECT LIFTING TECHNIQUES DO NOT:   Lock your knees when lifting, even if it is a small object.  Bend and twist. Pivot at your feet or move your feet when needing to change directions.  Assume that you can safely pick up even a paper clip without proper posture. Document Released: 08/29/2005 Document Revised: 11/21/2011 Document Reviewed: 12/11/2008 [facility] Patient Information 2015 [place], [place]. This information is not intended to replace advice given to you by your health care provider. Make sure you discuss any questions you have with your health care provider.     I personally performed the services described in this documentation, which was scribed in my presence. The recorded information has been reviewed and considered, and addended by me as needed.

## 2015-05-04 NOTE — Patient Instructions (Signed)
Ok to continue ibuprofen over the counter as needed, flexeril if needed for muscle spasm, heat to affected areas, stretches and range of motion.  I will refer you to neurology for the headaches. If they resolve - we can cancel this appointment.  Return to the clinic or go to the nearest emergency room if any of your symptoms worsen or new symptoms occur.  Concussion A concussion, or closed-head injury, is a brain injury caused by a direct blow to the head or by a quick and sudden movement (jolt) of the head or neck. Concussions are usually not life-threatening. Even so, the effects of a concussion can be serious. If you have had a concussion before, you are more likely to experience concussion-like symptoms after a direct blow to the head.  CAUSES  Direct blow to the head, such as from running into another player during a soccer game, being hit in a fight, or hitting your head on a hard surface.  A jolt of the head or neck that causes the brain to move back and forth inside the skull, such as in a car crash. SIGNS AND SYMPTOMS The signs of a concussion can be hard to notice. Early on, they may be missed by you, family members, and health care providers. You may look fine but act or feel differently. Symptoms are usually temporary, but they may last for days, weeks, or even longer. Some symptoms may appear right away while others may not show up for hours or days. Every head injury is different. Symptoms include:  Mild to moderate headaches that will not go away.  A feeling of pressure inside your head.  Having more trouble than usual:  Learning or remembering things you have heard.  Answering questions.  Paying attention or concentrating.  Organizing daily tasks.  Making decisions and solving problems.  Slowness in thinking, acting or reacting, speaking, or reading.  Getting lost or being easily confused.  Feeling tired all the time or lacking energy (fatigued).  Feeling  drowsy.  Sleep disturbances.  Sleeping more than usual.  Sleeping less than usual.  Trouble falling asleep.  Trouble sleeping (insomnia).  Loss of balance or feeling lightheaded or dizzy.  Nausea or vomiting.  Numbness or tingling.  Increased sensitivity to:  Sounds.  Lights.  Distractions.  Vision problems or eyes that tire easily.  Diminished sense of taste or smell.  Ringing in the ears.  Mood changes such as feeling sad or anxious.  Becoming easily irritated or angry for little or no reason.  Lack of motivation.  Seeing or hearing things other people do not see or hear (hallucinations). DIAGNOSIS Your health care provider can usually diagnose a concussion based on a description of your injury and symptoms. He or she will ask whether you passed out (lost consciousness) and whether you are having trouble remembering events that happened right before and during your injury. Your evaluation might include:  A brain scan to look for signs of injury to the brain. Even if the test shows no injury, you may still have a concussion.  Blood tests to be sure other problems are not present. TREATMENT  Concussions are usually treated in an emergency department, in urgent care, or at a clinic. You may need to stay in the hospital overnight for further treatment.  Tell your health care provider if you are taking any medicines, including prescription medicines, over-the-counter medicines, and natural remedies. Some medicines, such as blood thinners (anticoagulants) and aspirin, may increase the chance  of complications. Also tell your health care provider whether you have had alcohol or are taking illegal drugs. This information may affect treatment.  Your health care provider will send you home with important instructions to follow.  How fast you will recover from a concussion depends on many factors. These factors include how severe your concussion is, what part of your brain  was injured, your age, and how healthy you were before the concussion.  Most people with mild injuries recover fully. Recovery can take time. In general, recovery is slower in older persons. Also, persons who have had a concussion in the past or have other medical problems may find that it takes longer to recover from their current injury. HOME CARE INSTRUCTIONS General Instructions  Carefully follow the directions your health care provider gave you.  Only take over-the-counter or prescription medicines for pain, discomfort, or fever as directed by your health care provider.  Take only those medicines that your health care provider has approved.  Do not drink alcohol until your health care provider says you are well enough to do so. Alcohol and certain other drugs may slow your recovery and can put you at risk of further injury.  If it is harder than usual to remember things, write them down.  If you are easily distracted, try to do one thing at a time. For example, do not try to watch TV while fixing dinner.  Talk with family members or close friends when making important decisions.  Keep all follow-up appointments. Repeated evaluation of your symptoms is recommended for your recovery.  Watch your symptoms and tell others to do the same. Complications sometimes occur after a concussion. Older adults with a brain injury may have a higher risk of serious complications, such as a blood clot on the brain.  Tell your teachers, school nurse, school counselor, coach, athletic trainer, or work Production designer, theatre/television/film about your injury, symptoms, and restrictions. Tell them about what you can or cannot do. They should watch for:  Increased problems with attention or concentration.  Increased difficulty remembering or learning new information.  Increased time needed to complete tasks or assignments.  Increased irritability or decreased ability to cope with stress.  Increased symptoms.  Rest. Rest helps  the brain to heal. Make sure you:  Get plenty of sleep at night. Avoid staying up late at night.  Keep the same bedtime hours on weekends and weekdays.  Rest during the day. Take daytime naps or rest breaks when you feel tired.  Limit activities that require a lot of thought or concentration. These include:  Doing homework or job-related work.  Watching TV.  Working on the computer.  Avoid any situation where there is potential for another head injury (football, hockey, soccer, basketball, martial arts, downhill snow sports and horseback riding). Your condition will get worse every time you experience a concussion. You should avoid these activities until you are evaluated by the appropriate follow-up health care providers. Returning To Your Regular Activities You will need to return to your normal activities slowly, not all at once. You must give your body and brain enough time for recovery.  Do not return to sports or other athletic activities until your health care provider tells you it is safe to do so.  Ask your health care provider when you can drive, ride a bicycle, or operate heavy machinery. Your ability to react may be slower after a brain injury. Never do these activities if you are dizzy.  Ask your  health care provider about when you can return to work or school. Preventing Another Concussion It is very important to avoid another brain injury, especially before you have recovered. In rare cases, another injury can lead to permanent brain damage, brain swelling, or death. The risk of this is greatest during the first 7-10 days after a head injury. Avoid injuries by:  Wearing a seat belt when riding in a car.  Drinking alcohol only in moderation.  Wearing a helmet when biking, skiing, skateboarding, skating, or doing similar activities.  Avoiding activities that could lead to a second concussion, such as contact or recreational sports, until your health care provider says  it is okay.  Taking safety measures in your home.  Remove clutter and tripping hazards from floors and stairways.  Use grab bars in bathrooms and handrails by stairs.  Place non-slip mats on floors and in bathtubs.  Improve lighting in dim areas. SEEK MEDICAL CARE IF:  You have increased problems paying attention or concentrating.  You have increased difficulty remembering or learning new information.  You need more time to complete tasks or assignments than before.  You have increased irritability or decreased ability to cope with stress.  You have more symptoms than before. Seek medical care if you have any of the following symptoms for more than 2 weeks after your injury:  Lasting (chronic) headaches.  Dizziness or balance problems.  Nausea.  Vision problems.  Increased sensitivity to noise or light.  Depression or mood swings.  Anxiety or irritability.  Memory problems.  Difficulty concentrating or paying attention.  Sleep problems.  Feeling tired all the time. SEEK IMMEDIATE MEDICAL CARE IF:  You have severe or worsening headaches. These may be a sign of a blood clot in the brain.  You have weakness (even if only in one hand, leg, or part of the face).  You have numbness.  You have decreased coordination.  You vomit repeatedly.  You have increased sleepiness.  One pupil is larger than the other.  You have convulsions.  You have slurred speech.  You have increased confusion. This may be a sign of a blood clot in the brain.  You have increased restlessness, agitation, or irritability.  You are unable to recognize people or places.  You have neck pain.  It is difficult to wake you up.  You have unusual behavior changes.  You lose consciousness. MAKE SURE YOU:  Understand these instructions.  Will watch your condition.  Will get help right away if you are not doing well or get worse. Document Released: 11/19/2003 Document Revised:  09/03/2013 Document Reviewed: 03/21/2013 [facility] Patient Information 2015 [place], [place]. This information is not intended to replace advice given to you by your health care provider. Make sure you discuss any questions you have with your health care provider.   Cervical Sprain A cervical sprain is an injury in the neck in which the strong, fibrous tissues (ligaments) that connect your neck bones stretch or tear. Cervical sprains can range from mild to severe. Severe cervical sprains can cause the neck vertebrae to be unstable. This can lead to damage of the spinal cord and can result in serious nervous system problems. The amount of time it takes for a cervical sprain to get better depends on the cause and extent of the injury. Most cervical sprains heal in 1 to 3 weeks. CAUSES  Severe cervical sprains may be caused by:   Contact sport injuries (such as from football, rugby, wrestling, hockey,  auto racing, gymnastics, diving, martial arts, or boxing).   Motor vehicle collisions.   Whiplash injuries. This is an injury from a sudden forward and backward whipping movement of the head and neck.  Falls.  Mild cervical sprains may be caused by:   Being in an awkward position, such as while cradling a telephone between your ear and shoulder.   Sitting in a chair that does not offer proper support.   Working at a poorly Marketing executive station.   Looking up or down for long periods of time.  SYMPTOMS   Pain, soreness, stiffness, or a burning sensation in the front, back, or sides of the neck. This discomfort may develop immediately after the injury or slowly, 24 hours or more after the injury.   Pain or tenderness directly in the middle of the back of the neck.   Shoulder or upper back pain.   Limited ability to move the neck.   Headache.   Dizziness.   Weakness, numbness, or tingling in the hands or arms.   Muscle spasms.   Difficulty swallowing or chewing.    Tenderness and swelling of the neck.  DIAGNOSIS  Most of the time your health care provider can diagnose a cervical sprain by taking your history and doing a physical exam. Your health care provider will ask about previous neck injuries and any known neck problems, such as arthritis in the neck. X-rays may be taken to find out if there are any other problems, such as with the bones of the neck. Other tests, such as a CT scan or MRI, may also be needed.  TREATMENT  Treatment depends on the severity of the cervical sprain. Mild sprains can be treated with rest, keeping the neck in place (immobilization), and pain medicines. Severe cervical sprains are immediately immobilized. Further treatment is done to help with pain, muscle spasms, and other symptoms and may include:  Medicines, such as pain relievers, numbing medicines, or muscle relaxants.   Physical therapy. This may involve stretching exercises, strengthening exercises, and posture training. Exercises and improved posture can help stabilize the neck, strengthen muscles, and help stop symptoms from returning.  HOME CARE INSTRUCTIONS   Put ice on the injured area.   Put ice in a plastic bag.   Place a towel between your skin and the bag.   Leave the ice on for 15-20 minutes, 3-4 times a day.   If your injury was severe, you may have been given a cervical collar to wear. A cervical collar is a two-piece collar designed to keep your neck from moving while it heals.  Do not remove the collar unless instructed by your health care provider.  If you have long hair, keep it outside of the collar.  Ask your health care provider before making any adjustments to your collar. Minor adjustments may be required over time to improve comfort and reduce pressure on your chin or on the back of your head.  Ifyou are allowed to remove the collar for cleaning or bathing, follow your health care provider's instructions on how to do so  safely.  Keep your collar clean by wiping it with mild soap and water and drying it completely. If the collar you have been given includes removable pads, remove them every 1-2 days and hand wash them with soap and water. Allow them to air dry. They should be completely dry before you wear them in the collar.  If you are allowed to remove the collar for  cleaning and bathing, wash and dry the skin of your neck. Check your skin for irritation or sores. If you see any, tell your health care provider.  Do not drive while wearing the collar.   Only take over-the-counter or prescription medicines for pain, discomfort, or fever as directed by your health care provider.   Keep all follow-up appointments as directed by your health care provider.   Keep all physical therapy appointments as directed by your health care provider.   Make any needed adjustments to your workstation to promote good posture.   Avoid positions and activities that make your symptoms worse.   Warm up and stretch before being active to help prevent problems.  SEEK MEDICAL CARE IF:   Your pain is not controlled with medicine.   You are unable to decrease your pain medicine over time as planned.   Your activity level is not improving as expected.  SEEK IMMEDIATE MEDICAL CARE IF:   You develop any bleeding.  You develop stomach upset.  You have signs of an allergic reaction to your medicine.   Your symptoms get worse.   You develop new, unexplained symptoms.   You have numbness, tingling, weakness, or paralysis in any part of your body.  MAKE SURE YOU:   Understand these instructions.  Will watch your condition.  Will get help right away if you are not doing well or get worse. Document Released: 06/26/2007 Document Revised: 09/03/2013 Document Reviewed: 03/06/2013 [facility] Patient Information 2015 [place], [place]. This information is not intended to replace advice given to you by your health care  provider. Make sure you discuss any questions you have with your health care provider. Low Back Strain with Rehab A strain is an injury in which a tendon or muscle is torn. The muscles and tendons of the lower back are vulnerable to strains. However, these muscles and tendons are very strong and require a great force to be injured. Strains are classified into three categories. Grade 1 strains cause pain, but the tendon is not lengthened. Grade 2 strains include a lengthened ligament, due to the ligament being stretched or partially ruptured. With grade 2 strains there is still function, although the function may be decreased. Grade 3 strains involve a complete tear of the tendon or muscle, and function is usually impaired. SYMPTOMS   Pain in the lower back.  Pain that affects one side more than the other.  Pain that gets worse with movement and may be felt in the hip, buttocks, or back of the thigh.  Muscle spasms of the muscles in the back.  Swelling along the muscles of the back.  Loss of strength of the back muscles.  Crackling sound (crepitation) when the muscles are touched. CAUSES  Lower back strains occur when a force is placed on the muscles or tendons that is greater than they can handle. Common causes of injury include:  Prolonged overuse of the muscle-tendon units in the lower back, usually from incorrect posture.  A single violent injury or force applied to the back. RISK INCREASES WITH:  Sports that involve twisting forces on the spine or a lot of bending at the waist (football, rugby, weightlifting, bowling, golf, tennis, speed skating, racquetball, swimming, running, gymnastics, diving).  Poor strength and flexibility.  Failure to warm up properly before activity.  Family history of lower back pain or disk disorders.  Previous back injury or surgery (especially fusion).  Poor posture with lifting, especially heavy objects.  Prolonged sitting, especially with  poor  posture. PREVENTION   Learn and use proper posture when sitting or lifting (maintain proper posture when sitting, lift using the knees and legs, not at the waist).  Warm up and stretch properly before activity.  Allow for adequate recovery between workouts.  Maintain physical fitness:  Strength, flexibility, and endurance.  Cardiovascular fitness. PROGNOSIS  If treated properly, lower back strains usually heal within 6 weeks. RELATED COMPLICATIONS   Recurring symptoms, resulting in a chronic problem.  Chronic inflammation, scarring, and partial muscle-tendon tear.  Delayed healing or resolution of symptoms.  Prolonged disability. TREATMENT  Treatment first involves the use of ice and medicine, to reduce pain and inflammation. The use of strengthening and stretching exercises may help reduce pain with activity. These exercises may be performed at home or with a therapist. Severe injuries may require referral to a therapist for further evaluation and treatment, such as ultrasound. Your caregiver may advise that you wear a back brace or corset, to help reduce pain and discomfort. Often, prolonged bed rest results in greater harm then benefit. Corticosteroid injections may be recommended. However, these should be reserved for the most serious cases. It is important to avoid using your back when lifting objects. At night, sleep on your back on a firm mattress with a pillow placed under your knees. If non-surgical treatment is unsuccessful, surgery may be needed.  MEDICATION   If pain medicine is needed, nonsteroidal anti-inflammatory medicines (aspirin and ibuprofen), or other minor pain relievers (acetaminophen), are often advised.  Do not take pain medicine for 7 days before surgery.  Prescription pain relievers may be given, if your caregiver thinks they are needed. Use only as directed and only as much as you need.  Ointments applied to the skin may be helpful.  Corticosteroid  injections may be given by your caregiver. These injections should be reserved for the most serious cases, because they may only be given a certain number of times. HEAT AND COLD  Cold treatment (icing) should be applied for 10 to 15 minutes every 2 to 3 hours for inflammation and pain, and immediately after activity that aggravates your symptoms. Use ice packs or an ice massage.  Heat treatment may be used before performing stretching and strengthening activities prescribed by your caregiver, physical therapist, or athletic trainer. Use a heat pack or a warm water soak. SEEK MEDICAL CARE IF:   Symptoms get worse or do not improve in 2 to 4 weeks, despite treatment.  You develop numbness, weakness, or loss of bowel or bladder function.  New, unexplained symptoms develop. (Drugs used in treatment may produce side effects.) EXERCISES  RANGE OF MOTION (ROM) AND STRETCHING EXERCISES - Low Back Strain Most people with lower back pain will find that their symptoms get worse with excessive bending forward (flexion) or arching at the lower back (extension). The exercises which will help resolve your symptoms will focus on the opposite motion.  Your physician, physical therapist or athletic trainer will help you determine which exercises will be most helpful to resolve your lower back pain. Do not complete any exercises without first consulting with your caregiver. Discontinue any exercises which make your symptoms worse until you speak to your caregiver.  If you have pain, numbness or tingling which travels down into your buttocks, leg or foot, the goal of the therapy is for these symptoms to move closer to your back and eventually resolve. Sometimes, these leg symptoms will get better, but your lower back pain may worsen. This  is typically an indication of progress in your rehabilitation. Be very alert to any changes in your symptoms and the activities in which you participated in the 24 hours prior to the  change. Sharing this information with your caregiver will allow him/her to most efficiently treat your condition.  These exercises may help you when beginning to rehabilitate your injury. Your symptoms may resolve with or without further involvement from your physician, physical therapist or athletic trainer. While completing these exercises, remember:  Restoring tissue flexibility helps normal motion to return to the joints. This allows healthier, less painful movement and activity.  An effective stretch should be held for at least 30 seconds.  A stretch should never be painful. You should only feel a gentle lengthening or release in the stretched tissue. FLEXION RANGE OF MOTION AND STRETCHING EXERCISES: STRETCH - Flexion, Single Knee to Chest   Lie on a firm bed or floor with both legs extended in front of you.  Keeping one leg in contact with the floor, bring your opposite knee to your chest. Hold your leg in place by either grabbing behind your thigh or at your knee.  Pull until you feel a gentle stretch in your lower back. Hold __________ seconds.  Slowly release your grasp and repeat the exercise with the opposite side. Repeat __________ times. Complete this exercise __________ times per day.  STRETCH - Flexion, Double Knee to Chest   Lie on a firm bed or floor with both legs extended in front of you.  Keeping one leg in contact with the floor, bring your opposite knee to your chest.  Tense your stomach muscles to support your back and then lift your other knee to your chest. Hold your legs in place by either grabbing behind your thighs or at your knees.  Pull both knees toward your chest until you feel a gentle stretch in your lower back. Hold __________ seconds.  Tense your stomach muscles and slowly return one leg at a time to the floor. Repeat __________ times. Complete this exercise __________ times per day.  STRETCH - Low Trunk Rotation  Lie on a firm bed or floor.  Keeping your legs in front of you, bend your knees so they are both pointed toward the ceiling and your feet are flat on the floor.  Extend your arms out to the side. This will stabilize your upper body by keeping your shoulders in contact with the floor.  Gently and slowly drop both knees together to one side until you feel a gentle stretch in your lower back. Hold for __________ seconds.  Tense your stomach muscles to support your lower back as you bring your knees back to the starting position. Repeat the exercise to the other side. Repeat __________ times. Complete this exercise __________ times per day  EXTENSION RANGE OF MOTION AND FLEXIBILITY EXERCISES: STRETCH - Extension, Prone on Elbows   Lie on your stomach on the floor, a bed will be too soft. Place your palms about shoulder width apart and at the height of your head.  Place your elbows under your shoulders. If this is too painful, stack pillows under your chest.  Allow your body to relax so that your hips drop lower and make contact more completely with the floor.  Hold this position for __________ seconds.  Slowly return to lying flat on the floor. Repeat __________ times. Complete this exercise __________ times per day.  RANGE OF MOTION - Extension, Prone Press Ups  Lie on your  stomach on the floor, a bed will be too soft. Place your palms about shoulder width apart and at the height of your head.  Keeping your back as relaxed as possible, slowly straighten your elbows while keeping your hips on the floor. You may adjust the placement of your hands to maximize your comfort. As you gain motion, your hands will come more underneath your shoulders.  Hold this position __________ seconds.  Slowly return to lying flat on the floor. Repeat __________ times. Complete this exercise __________ times per day.  RANGE OF MOTION- Quadruped, Neutral Spine   Assume a hands and knees position on a firm surface. Keep your hands under  your shoulders and your knees under your hips. You may place padding under your knees for comfort.  Drop your head and point your tail bone toward the ground below you. This will round out your lower back like an angry cat. Hold this position for __________ seconds.  Slowly lift your head and release your tail bone so that your back sags into a large arch, like an old horse.  Hold this position for __________ seconds.  Repeat this until you feel limber in your lower back.  Now, find your "sweet spot." This will be the most comfortable position somewhere between the two previous positions. This is your neutral spine. Once you have found this position, tense your stomach muscles to support your lower back.  Hold this position for __________ seconds. Repeat __________ times. Complete this exercise __________ times per day.  STRENGTHENING EXERCISES - Low Back Strain These exercises may help you when beginning to rehabilitate your injury. These exercises should be done near your "sweet spot." This is the neutral, low-back arch, somewhere between fully rounded and fully arched, that is your least painful position. When performed in this safe range of motion, these exercises can be used for people who have either a flexion or extension based injury. These exercises may resolve your symptoms with or without further involvement from your physician, physical therapist or athletic trainer. While completing these exercises, remember:   Muscles can gain both the endurance and the strength needed for everyday activities through controlled exercises.  Complete these exercises as instructed by your physician, physical therapist or athletic trainer. Increase the resistance and repetitions only as guided.  You may experience muscle soreness or fatigue, but the pain or discomfort you are trying to eliminate should never worsen during these exercises. If this pain does worsen, stop and make certain you are  following the directions exactly. If the pain is still present after adjustments, discontinue the exercise until you can discuss the trouble with your caregiver. STRENGTHENING - Deep Abdominals, Pelvic Tilt  Lie on a firm bed or floor. Keeping your legs in front of you, bend your knees so they are both pointed toward the ceiling and your feet are flat on the floor.  Tense your lower abdominal muscles to press your lower back into the floor. This motion will rotate your pelvis so that your tail bone is scooping upwards rather than pointing at your feet or into the floor.  With a gentle tension and even breathing, hold this position for __________ seconds. Repeat __________ times. Complete this exercise __________ times per day.  STRENGTHENING - Abdominals, Crunches   Lie on a firm bed or floor. Keeping your legs in front of you, bend your knees so they are both pointed toward the ceiling and your feet are flat on the floor. Cross your arms  over your chest.  Slightly tip your chin down without bending your neck.  Tense your abdominals and slowly lift your trunk high enough to just clear your shoulder blades. Lifting higher can put excessive stress on the lower back and does not further strengthen your abdominal muscles.  Control your return to the starting position. Repeat __________ times. Complete this exercise __________ times per day.  STRENGTHENING - Quadruped, Opposite UE/LE Lift   Assume a hands and knees position on a firm surface. Keep your hands under your shoulders and your knees under your hips. You may place padding under your knees for comfort.  Find your neutral spine and gently tense your abdominal muscles so that you can maintain this position. Your shoulders and hips should form a rectangle that is parallel with the floor and is not twisted.  Keeping your trunk steady, lift your right hand no higher than your shoulder and then your left leg no higher than your hip. Make sure  you are not holding your breath. Hold this position __________ seconds.  Continuing to keep your abdominal muscles tense and your back steady, slowly return to your starting position. Repeat with the opposite arm and leg. Repeat __________ times. Complete this exercise __________ times per day.  STRENGTHENING - Lower Abdominals, Double Knee Lift  Lie on a firm bed or floor. Keeping your legs in front of you, bend your knees so they are both pointed toward the ceiling and your feet are flat on the floor.  Tense your abdominal muscles to brace your lower back and slowly lift both of your knees until they come over your hips. Be certain not to hold your breath.  Hold __________ seconds. Using your abdominal muscles, return to the starting position in a slow and controlled manner. Repeat __________ times. Complete this exercise __________ times per day.  POSTURE AND BODY MECHANICS CONSIDERATIONS - Low Back Strain Keeping correct posture when sitting, standing or completing your activities will reduce the stress put on different body tissues, allowing injured tissues a chance to heal and limiting painful experiences. The following are general guidelines for improved posture. Your physician or physical therapist will provide you with any instructions specific to your needs. While reading these guidelines, remember:  The exercises prescribed by your provider will help you have the flexibility and strength to maintain correct postures.  The correct posture provides the best environment for your joints to work. All of your joints have less wear and tear when properly supported by a spine with good posture. This means you will experience a healthier, less painful body.  Correct posture must be practiced with all of your activities, especially prolonged sitting and standing. Correct posture is as important when doing repetitive low-stress activities (typing) as it is when doing a single heavy-load activity  (lifting). RESTING POSITIONS Consider which positions are most painful for you when choosing a resting position. If you have pain with flexion-based activities (sitting, bending, stooping, squatting), choose a position that allows you to rest in a less flexed posture. You would want to avoid curling into a fetal position on your side. If your pain worsens with extension-based activities (prolonged standing, working overhead), avoid resting in an extended position such as sleeping on your stomach. Most people will find more comfort when they rest with their spine in a more neutral position, neither too rounded nor too arched. Lying on a non-sagging bed on your side with a pillow between your knees, or on your back with a  pillow under your knees will often provide some relief. Keep in mind, being in any one position for a prolonged period of time, no matter how correct your posture, can still lead to stiffness. PROPER SITTING POSTURE In order to minimize stress and discomfort on your spine, you must sit with correct posture. Sitting with good posture should be effortless for a healthy body. Returning to good posture is a gradual process. Many people can work toward this most comfortably by using various supports until they have the flexibility and strength to maintain this posture on their own. When sitting with proper posture, your ears will fall over your shoulders and your shoulders will fall over your hips. You should use the back of the chair to support your upper back. Your lower back will be in a neutral position, just slightly arched. You may place a small pillow or folded towel at the base of your lower back for support.  When working at a desk, create an environment that supports good, upright posture. Without extra support, muscles tire, which leads to excessive strain on joints and other tissues. Keep these recommendations in mind: CHAIR:  A chair should be able to slide under your desk when your  back makes contact with the back of the chair. This allows you to work closely.  The chair's height should allow your eyes to be level with the upper part of your monitor and your hands to be slightly lower than your elbows. BODY POSITION  Your feet should make contact with the floor. If this is not possible, use a foot rest.  Keep your ears over your shoulders. This will reduce stress on your neck and lower back. INCORRECT SITTING POSTURES  If you are feeling tired and unable to assume a healthy sitting posture, do not slouch or slump. This puts excessive strain on your back tissues, causing more damage and pain. Healthier options include:  Using more support, like a lumbar pillow.  Switching tasks to something that requires you to be upright or walking.  Talking a brief walk.  Lying down to rest in a neutral-spine position. PROLONGED STANDING WHILE SLIGHTLY LEANING FORWARD  When completing a task that requires you to lean forward while standing in one place for a long time, place either foot up on a stationary 2-4 inch high object to help maintain the best posture. When both feet are on the ground, the lower back tends to lose its slight inward curve. If this curve flattens (or becomes too large), then the back and your other joints will experience too much stress, tire more quickly, and can cause pain. CORRECT STANDING POSTURES Proper standing posture should be assumed with all daily activities, even if they only take a few moments, like when brushing your teeth. As in sitting, your ears should fall over your shoulders and your shoulders should fall over your hips. You should keep a slight tension in your abdominal muscles to brace your spine. Your tailbone should point down to the ground, not behind your body, resulting in an over-extended swayback posture.  INCORRECT STANDING POSTURES  Common incorrect standing postures include a forward head, locked knees and/or an excessive  swayback. WALKING Walk with an upright posture. Your ears, shoulders and hips should all line-up. PROLONGED ACTIVITY IN A FLEXED POSITION When completing a task that requires you to bend forward at your waist or lean over a low surface, try to find a way to stabilize 3 out of 4 of your limbs. You  can place a hand or elbow on your thigh or rest a knee on the surface you are reaching across. This will provide you more stability so that your muscles do not fatigue as quickly. By keeping your knees relaxed, or slightly bent, you will also reduce stress across your lower back. CORRECT LIFTING TECHNIQUES DO :   Assume a wide stance. This will provide you more stability and the opportunity to get as close as possible to the object which you are lifting.  Tense your abdominals to brace your spine. Bend at the knees and hips. Keeping your back locked in a neutral-spine position, lift using your leg muscles. Lift with your legs, keeping your back straight.  Test the weight of unknown objects before attempting to lift them.  Try to keep your elbows locked down at your sides in order get the best strength from your shoulders when carrying an object.  Always ask for help when lifting heavy or awkward objects. INCORRECT LIFTING TECHNIQUES DO NOT:   Lock your knees when lifting, even if it is a small object.  Bend and twist. Pivot at your feet or move your feet when needing to change directions.  Assume that you can safely pick up even a paper clip without proper posture. Document Released: 08/29/2005 Document Revised: 11/21/2011 Document Reviewed: 12/11/2008 [facility] Patient Information 2015 [place], [place]. This information is not intended to replace advice given to you by your health care provider. Make sure you discuss any questions you have with your health care provider.

## 2015-06-15 ENCOUNTER — Ambulatory Visit (INDEPENDENT_AMBULATORY_CARE_PROVIDER_SITE_OTHER): Payer: 59 | Admitting: Diagnostic Neuroimaging

## 2015-06-15 ENCOUNTER — Encounter: Payer: Self-pay | Admitting: Diagnostic Neuroimaging

## 2015-06-15 VITALS — BP 118/79 | HR 56 | Ht 66.0 in | Wt 158.0 lb

## 2015-06-15 DIAGNOSIS — G43009 Migraine without aura, not intractable, without status migrainosus: Secondary | ICD-10-CM | POA: Insufficient documentation

## 2015-06-15 DIAGNOSIS — F0781 Postconcussional syndrome: Secondary | ICD-10-CM | POA: Diagnosis not present
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
## 2015-06-15 NOTE — Patient Instructions (Signed)
Thank you for coming to see [place] at [facility]. I hope we have been able to provide you high quality care today.  You may receive a patient satisfaction survey over the next few weeks. We would appreciate your feedback and comments so that we may continue to improve ourselves and the health of our patients.  - try tracking headaches in headache diary - consider chiropractor [facility] chiropractic) or Integrative therapies    ~~~~~~~~~~~~~~~~~~~~~~~~~~~~~~~~~~~~~~~~~~~~~~~~~~~~~~~~~~~~~~~~~  DR. Lorne Winkels'S GUIDE TO HAPPY AND HEALTHY LIVING These are some of my general health and wellness recommendations. Some of them may apply to you better than others. Please use common sense as you try these suggestions and feel free to ask me any questions.   ACTIVITY/FITNESS Mental, social, emotional and physical stimulation are very important for brain and body health. Try learning a new activity (arts, music, language, sports, games).  Keep moving your body to the best of your abilities. You can do this at home, inside or outside, the park, community center, gym or anywhere you like. Consider a physical therapist or personal trainer to get started. Consider the app Sworkit. Fitness trackers such as smart-watches, smart-phones or Fitbits can help as well.   NUTRITION Eat more plants: colorful vegetables, nuts, seeds and berries.  Eat less sugar, salt, preservatives and processed foods.  Avoid toxins such as cigarettes and alcohol.  Drink water when you are thirsty. Warm water with a slice of lemon is an excellent morning drink to start the day.  Consider these websites for more information The Nutrition Source (https://www.henry-hernandez.biz/) [employer] Nutrition (WindowBlog.ch)   RELAXATION Consider practicing mindfulness meditation or other relaxation techniques such as deep breathing, prayer, yoga, tai chi, massage. See website  mindful.org or the apps Headspace or Calm to help get started.   SLEEP Try to get at least 7-8+ hours sleep per day. Regular exercise and reduced caffeine will help you sleep better. Practice good sleep hygeine techniques. See website sleep.org for more information.   PLANNING Prepare estate planning, living will, healthcare POA documents. Sometimes this is best planned with the help of an attorney. Theconversationproject.org and agingwithdignity.org are excellent resources.

## 2015-08-13 ENCOUNTER — Ambulatory Visit (INDEPENDENT_AMBULATORY_CARE_PROVIDER_SITE_OTHER): Payer: 59 | Admitting: Family Medicine

## 2015-08-13 VITALS — BP 118/68 | HR 61 | Temp 98.6°F | Resp 17 | Ht 65.5 in | Wt 159.0 lb

## 2015-08-13 DIAGNOSIS — N939 Abnormal uterine and vaginal bleeding, unspecified: Secondary | ICD-10-CM

## 2015-08-13 LAB — POCT URINE PREGNANCY: PREG TEST UR: NEGATIVE

## 2015-08-13 LAB — CBC
HCT: 40.7 % (ref 36.0–46.0)
HEMOGLOBIN: 13.8 g/dL (ref 12.0–15.0)
MCH: 33.9 pg (ref 26.0–34.0)
MCHC: 33.9 g/dL (ref 30.0–36.0)
MCV: 100 fL (ref 78.0–100.0)
MPV: 10.9 fL (ref 8.6–12.4)
PLATELETS: 170 10*3/uL (ref 150–400)
RBC: 4.07 MIL/uL (ref 3.87–5.11)
RDW: 12.6 % (ref 11.5–15.5)
WBC: 6.2 10*3/uL (ref 4.0–10.5)

## 2015-08-13 LAB — POCT WET + KOH PREP
TRICH BY WET PREP: ABSENT
YEAST BY KOH: ABSENT
YEAST BY WET PREP: ABSENT

## 2015-08-13 LAB — TSH: TSH: 2.522 u[IU]/mL (ref 0.350–4.500)

## 2015-08-13 NOTE — Patient Instructions (Addendum)
The labs seen today were not very telling at this time.  I am submitting the Pap, TSH (thyroid), and a complete blood count.  If these do not demonstrate a culprit for the bleeding, we will then refer to gynecology.  They may suggest an ultrasound trans-vaginally, and will go from there.   Metrorrhagia Metrorrhagia is bleeding from the uterus that happens irregularly but often. The bleeding generally happens between menstrual periods. HOME CARE INSTRUCTIONS Pay attention to any changes in your symptoms. Follow these instructions to help with your condition: Eating  Eat well-balanced meals. Include foods that are high in iron, such as liver, meat, shellfish, green leafy vegetables, and eggs.  If you become constipated:  Drink plenty of water.  Eat fruits and vegetables that are high in water and fiber, such as spinach, carrots, raspberries, apples, and mango. Medicines  Take over-the-counter and prescription medicines only as told by your health care provider.  Do not change medicines without talking with your health care provider.  Aspirin or medicines that contain aspirin may make the bleeding worse. Do not take those medicines:  During the week before your period.  During your period.  If you were prescribed iron pills, take them as told by your health care provider. Iron pills help to replace iron that your body loses because of this condition. Activity  If you need to change your sanitary pad or tampon more than one time every 2 hours:  Lie in bed with your feet raised (elevated).  Place a cold pack on your lower abdomen.  Rest as much as possible until the bleeding stops or slows down.  Do not try to lose weight until the bleeding has stopped and your blood iron level is back to normal. Other Instructions  For two months, write down:  When your period starts.  When your period ends.  When any abnormal bleeding occurs.  What problems you notice.  Keep all  follow-up visits as told by your health care provider. This is important. SEEK MEDICAL CARE IF:  You get light-headed or weak.  You have nausea and vomiting.  You cannot eat or drink without vomiting.  You feel dizzy or have diarrhea while you are taking medicine.  You are taking birth control pills or hormones, and you want to change them or stop taking them. SEEK IMMEDIATE MEDICAL CARE IF:  You develop a fever or chills.  You need to change your sanitary pad or tampon more than one time per hour.  Your bleeding becomesheavy.  Your flow contains clots.  You develop pain in your abdomen.  You lose consciousness.  You develop a rash.   This information is not intended to replace advice given to you by your health care provider. Make sure you discuss any questions you have with your health care provider.   Document Released: 08/29/2005 Document Revised: 05/20/2015 Document Reviewed: 11/24/2014 Elsevier Interactive Patient Education [employer]2016 Elsevier Inc.

## 2015-08-13 NOTE — Progress Notes (Signed)
[facility] [address], [place] [id number] [phone]  Date:  08/13/2015   Name:  Penny Kelly   DOB:  02/02/1986   MRN:  [id number]  PCP:  Shade FloodGREENE,JEFFREY R, MD   Chief Complaint  Patient presents with  . Vaginal Itching    History of Present Illness:  Penny Kelly is a 29 y.o. female patient [facility] complaint of vaginal bleeding intermittently for 1 month. Spotting started about 3 weeks ago (November 8) with spotting before a more pronounced menstrual cycle (November 19).  The bleeding resolved,10 days ago, the spotting resumed.  It is minimal spotting, not requiring a sanitary napkin, but in only one instant she had felt a gush, that required a change of clothing.  This is not associated with discharge, abdominal pain, odor, vaginal pruritus, or fever.  She has no urinary symptoms of frequency, hematuria, or dysuria.   She is currently not sexually active, and practicing celibacy since 02/2015--condoms used.  Novelty device only, which she states she keeps clean.  Uses water based lubricant.  Dove unfragranted for hygiene cleansing. Generally regular menses.  14 or 15 irregular menses with bleeding for 2 weeks.  Used depo shot which helped menses, but caused considerable weight gain.  No OCP for years. 2 children, ages 75 and 2 Last pap may have been 1.5 years ago by a [place] gynecologist.  No abnormality.  One hx incident of an abnormal pap with poss. Colposcopy??  Showed "overgrowth of cervix".   No added stressors.  Drinks a lot of coffee.  Gym/exercise is average, and new within the last 3 months.  She will work out as much as 5 times per week.  No appetite change.  No abnormal bloating.     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     Patient Active Problem List   Diagnosis Date Noted  . Migraine without aura and without status migrainosus, not intractable 06/15/2015  . Post concussion  syndrome 06/15/2015    Past Medical History  Diagnosis Date  . Headache, common migraine     hx migraines age 599-25    No past surgical history on file.  Social History  Substance Use Topics  . Smoking status: Never Smoker   . Smokeless tobacco: None  . Alcohol Use: 0.0 oz/week    0 Standard drinks or equivalent per week     Comment: rarely, 2 x a week    Family History  Problem Relation Age of Onset  . Stroke Father   . Diabetes Father   . Hypertension Father     No Known Allergies  Medication list has been reviewed and updated.  Current Outpatient Prescriptions on File Prior to Visit  Medication Sig Dispense Refill  . cyclobenzaprine (FLEXERIL) 5 MG tablet Take 0.5-1 tablets (2.5-5 mg total) by mouth 3 (three) times daily as needed. start at bedtime as needed due to sedation 15 tablet 0  . ibuprofen (ADVIL,MOTRIN) 400 MG tablet Take 400 mg by mouth every 6 (six) hours as needed.    . triamcinolone ointment (KENALOG) 0.1 %      No current facility-administered medications on file prior to visit.    ROS ROS otherwise unremarkable unless listed above.   Physical Examination: BP 118/68 mmHg  Pulse 61  Temp(Src) 98.6 F (37 C) (Oral)  Resp 17  Ht 5' 5.5" (1.664 m)  Wt 159 lb (72.122 kg)  BMI  26.05 kg/m2  SpO2 99%  LMP 08/11/2015 Ideal Body Weight: Weight in (lb) to have BMI = 25: 152.2  Physical Exam  Constitutional: She is oriented to person, place, and time. She appears well-developed and well-nourished. No distress.  HENT:  Head: Normocephalic and atraumatic.  Right Ear: External ear normal.  Left Ear: External ear normal.  Eyes: Conjunctivae and EOM are normal. Pupils are equal, round, and reactive to light.  Cardiovascular: Normal rate.   Pulmonary/Chest: Effort normal. No respiratory distress.  Genitourinary: Uterus normal. Pelvic exam was performed with patient supine. There is no rash or tenderness on the right labia. There is no rash or tenderness  on the left labia. Uterus is not tender. Cervix exhibits no motion tenderness, no discharge and no friability. Right adnexum displays no mass, no tenderness and no fullness. Left adnexum displays no mass, no tenderness and no fullness. There is bleeding in the vagina. No erythema in the vagina. No foreign body around the vagina. No signs of injury around the vagina. No vaginal discharge found.  No uterine masses appreciated. Darkened blood within the vaginal vault with origin appearing within the cervical os.   Neurological: She is alert and oriented to person, place, and time.  Skin: She is not diaphoretic.  Psychiatric: She has a normal mood and affect. Her behavior is normal.    Results for orders placed or performed in visit on 08/13/15  TSH  Result Value Ref Range   TSH 2.522 0.350 - 4.500 uIU/mL  CBC  Result Value Ref Range   WBC 6.2 4.0 - 10.5 K/uL   RBC 4.07 3.87 - 5.11 MIL/uL   Hemoglobin 13.8 12.0 - 15.0 g/dL   HCT 16.1 09.6 - 04.5 %   MCV 100.0 78.0 - 100.0 fL   MCH 33.9 26.0 - 34.0 pg   MCHC 33.9 30.0 - 36.0 g/dL   RDW 40.9 81.1 - 91.4 %   Platelets 170 150 - 400 K/uL   MPV 10.9 8.6 - 12.4 fL  POCT Wet + KOH Prep  Result Value Ref Range   Yeast by KOH Absent Present, Absent   Yeast by wet prep Absent Present, Absent   WBC by wet prep Many (A) None, Few, Too numerous to count   Clue Cells Wet Prep HPF POC None None, Too numerous to count   Trich by wet prep Absent Present, Absent   Bacteria Wet Prep HPF POC Moderate (A) None, Few, Too numerous to count   Epithelial Cells By [employer] ([facility]) Many (A) None, Few, Too numerous to count   RBC,UR,HPF,POC Moderate (A) None RBC/hpf  POCT urine pregnancy  Result Value Ref Range   Preg Test, Ur Negative Negative      Assessment and Plan: Penny Kelly is a 29 y.o. female who is here today for vaginal bleeding.  Appears to be abnormal uterine bleeding.  Thyroid is normal.  Cbc and vitals within normal limits.   -Poss. OCP  non-use is causing a recurrence of irregular bleeding, fibroid, neoplasm -At this time, gynecology consult is appreciated.  Pap was not sent off, and I do not wish for her to undergo this as she will likely be referred with normal or abnormal findings.  Transvaginal ultrasound may be necessary and accessible with gynecologist. -advised patient to eat iron rich foods, and vegetables at this time. -alarming sxs discussed to warrant an immediate return.  Abnormal uterine bleeding (AUB)  Vaginal bleeding - Plan: TSH, POCT Wet + KOH Prep, POCT  urine pregnancy, CBC, Ambulatory referral to Gynecology, CANCELED: POCT CBC    Trena Platt, PA-C [facility] 08/13/2015 9:15 AM

## 2015-08-17 NOTE — Progress Notes (Signed)
Agree with A/P. Dr Teagyn Fishel 

## 2015-08-27 ENCOUNTER — Ambulatory Visit: Payer: 59 | Admitting: Diagnostic Neuroimaging

## 2015-10-14 ENCOUNTER — Other Ambulatory Visit: Payer: Self-pay | Admitting: Urgent Care

## 2015-10-14 ENCOUNTER — Ambulatory Visit (INDEPENDENT_AMBULATORY_CARE_PROVIDER_SITE_OTHER): Payer: BLUE CROSS/BLUE SHIELD | Admitting: Family Medicine

## 2015-10-14 VITALS — BP 116/80 | HR 53 | Temp 98.5°F | Resp 14 | Ht 66.0 in | Wt 164.4 lb

## 2015-10-14 DIAGNOSIS — N760 Acute vaginitis: Secondary | ICD-10-CM | POA: Diagnosis not present

## 2015-10-14 DIAGNOSIS — R300 Dysuria: Secondary | ICD-10-CM | POA: Diagnosis not present

## 2015-10-14 DIAGNOSIS — N926 Irregular menstruation, unspecified: Secondary | ICD-10-CM

## 2015-10-14 LAB — POCT WET + KOH PREP
Trich by wet prep: ABSENT
Yeast by KOH: ABSENT
Yeast by wet prep: ABSENT

## 2015-10-14 LAB — POC MICROSCOPIC URINALYSIS (UMFC): Mucus: ABSENT

## 2015-10-14 LAB — POCT URINALYSIS DIP (MANUAL ENTRY)
Bilirubin, UA: NEGATIVE
Blood, UA: NEGATIVE
Glucose, UA: NEGATIVE
Ketones, POC UA: NEGATIVE
Leukocytes, UA: NEGATIVE
Nitrite, UA: NEGATIVE
Protein Ur, POC: NEGATIVE
Spec Grav, UA: 1.02
Urobilinogen, UA: 0.2
pH, UA: 7.5

## 2015-10-14 LAB — POCT URINE PREGNANCY: Preg Test, Ur: NEGATIVE

## 2015-10-14 MED ORDER — NITROFURANTOIN MONOHYD MACRO 100 MG PO CAPS: 100.0000 mg | ORAL_CAPSULE | Freq: Two times a day (BID) | ORAL | Status: DC

## 2015-10-14 MED ORDER — METRONIDAZOLE 500 MG PO TABS: 500.0000 mg | ORAL_TABLET | Freq: Two times a day (BID) | ORAL | Status: DC

## 2015-10-14 NOTE — Progress Notes (Addendum)
Subjective:    Patient ID: Penny Kelly, female    DOB: [birth year], 30 y.o.   MRN: [id number] By signing my name below, I, Penny Kelly, attest that this documentation has been prepared under the direction and in the presence of Penny Sidle, MD.  Electronically Signed: Littie Kelly, Medical Scribe. 10/14/2015. 11:35 AM.  HPI HPI Comments: Penny Kelly is a 30 y.o. female who [facility] complaining cramping abdominal pain that started 3 days ago. Patient reports having associated dysuria. She has been taking Azo which has been providing relief. She states she last had a UTI about 10 years ago. Patient also reports having abnormal menstrual periods for the past 4 months. The periods do not occur regularly and also last longer than they should, about 2-3 weeks. She does not take any regular medications. Patient is currently single. Her last pap smear was about 2 years ago. She does not have a gynecologist. She denies taking any birth control.    Review of Systems  Gastrointestinal: Positive for abdominal pain.  Genitourinary: Positive for dysuria and menstrual problem.       Objective:   Physical Exam CONSTITUTIONAL: Well developed/well nourished HEAD: Normocephalic/atraumatic EYES: EOM/PERRL ENMT: Mucous membranes moist NECK: supple no meningeal signs SPINE: entire spine nontender CV: S1/S2 noted, no murmurs/rubs/gallops noted LUNGS: Lungs are clear to auscultation bilaterally, no apparent distress ABDOMEN: soft, nontender, no rebound or guarding GU: no cva tenderness NEURO: Pt is awake/alert, moves all extremitiesx4 EXTREMITIES: pulses normal, full ROM SKIN: warm, color normal PSYCH: no abnormalities of mood noted   pelvic: Normal external genitalia, moderate amount of weight creamy discharge the vagina, normal cervix , no cervical motion tenderness Results for orders placed or performed in visit on 10/14/15  POCT urinalysis dipstick    Result Value Ref Range   Color, UA yellow yellow   Clarity, UA cloudy (A) clear   Glucose, UA negative negative   Bilirubin, UA negative negative   Ketones, POC UA negative negative   Spec Grav, UA 1.020    Blood, UA negative negative   pH, UA 7.5    Protein Ur, POC negative negative   Urobilinogen, UA 0.2    Nitrite, UA Negative Negative   Leukocytes, UA Negative Negative  POCT Microscopic Urinalysis ([facility])  Result Value Ref Range   WBC,UR,HPF,POC Few (A) None WBC/hpf   RBC,UR,HPF,POC None None RBC/hpf   Bacteria None None, Too numerous to count   Mucus Absent Absent   Epithelial Cells, UR Per Microscopy Few (A) None, Too numerous to count cells/hpf  POCT Wet + KOH Prep  Result Value Ref Range   Yeast by KOH Absent Present, Absent   Yeast by wet prep Absent Present, Absent   WBC by wet prep Few None, Few, Too numerous to count   Clue Cells Wet Prep HPF POC Few (A) None, Too numerous to count   Trich by wet prep Absent Present, Absent   Bacteria Wet Prep HPF POC Moderate (A) None, Few, Too numerous to count   Epithelial Cells By [employer] ([facility]) Moderate (A) None, Few, Too numerous to count   RBC,UR,HPF,POC None None RBC/hpf        Assessment & Plan:    This chart was scribed in my presence and reviewed by me personally.    ICD-9-CM ICD-10-CM   1. Dysuria 788.1 R30.0 POCT urinalysis dipstick     POCT Microscopic Urinalysis ([facility])  2. Menses, irregular 626.4 N92.6 Ambulatory referral  to Gynecology  3. Vaginitis and vulvovaginitis 616.10 N76.0 POCT Wet + KOH Prep     Signed, Penny Sidle, MD

## 2015-10-16 LAB — URINE CULTURE: Colony Count: 1000
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
## 2016-12-10 ENCOUNTER — Encounter (HOSPITAL_COMMUNITY): Payer: Self-pay | Admitting: *Deleted

## 2016-12-10 ENCOUNTER — Ambulatory Visit (HOSPITAL_COMMUNITY)
Admission: EM | Admit: 2016-12-10 | Discharge: 2016-12-10 | Disposition: A | Discharge status: Home / Self Care | Payer: BLUE CROSS/BLUE SHIELD | Attending: Internal Medicine | Admitting: Internal Medicine

## 2016-12-10 DIAGNOSIS — N926 Irregular menstruation, unspecified: Secondary | ICD-10-CM | POA: Diagnosis not present

## 2016-12-10 DIAGNOSIS — H6502 Acute serous otitis media, left ear: Secondary | ICD-10-CM | POA: Diagnosis not present

## 2016-12-10 DIAGNOSIS — N898 Other specified noninflammatory disorders of vagina: Secondary | ICD-10-CM | POA: Diagnosis present

## 2016-12-10 DIAGNOSIS — H9202 Otalgia, left ear: Secondary | ICD-10-CM | POA: Diagnosis present

## 2016-12-10 LAB — POCT URINALYSIS DIP (DEVICE)
BILIRUBIN URINE: NEGATIVE
GLUCOSE, UA: NEGATIVE mg/dL
Hgb urine dipstick: NEGATIVE
KETONES UR: NEGATIVE mg/dL
LEUKOCYTES UA: NEGATIVE
Nitrite: NEGATIVE
Protein, ur: NEGATIVE mg/dL
UROBILINOGEN UA: 0.2 mg/dL (ref 0.0–1.0)
pH: 6 (ref 5.0–8.0)

## 2016-12-10 LAB — POCT PREGNANCY, URINE: Preg Test, Ur: NEGATIVE

## 2016-12-10 MED ORDER — IPRATROPIUM BROMIDE 0.06 % NA SOLN: 2.0000 | Freq: Four times a day (QID) | NASAL | 0 refills | Status: DC

## 2016-12-10 MED ORDER — IPRATROPIUM BROMIDE 0.06 % NA SOLN: 2.0000 | Freq: Four times a day (QID) | NASAL | 0 refills | Status: AC

## 2016-12-10 MED ORDER — AMOXICILLIN 875 MG PO TABS: 875.0000 mg | ORAL_TABLET | Freq: Two times a day (BID) | ORAL | 0 refills | Status: AC

## 2016-12-10 MED ORDER — AMOXICILLIN 875 MG PO TABS: 875.0000 mg | ORAL_TABLET | Freq: Two times a day (BID) | ORAL | 0 refills | Status: DC

## 2016-12-10 NOTE — ED Triage Notes (Addendum)
Cloudy   Bilateral   Earache     Symptoms  X  1  Week        Swelling  Behind  l   Earlobe       Clear  Vaginal   Discharge

## 2016-12-10 NOTE — ED Provider Notes (Signed)
CSN: [id number]     Arrival date & time 12/10/16  1937 History   None    Chief Complaint  Patient presents with  . Otalgia   (Consider location/radiation/quality/duration/timing/severity/associated sxs/prior Treatment) Patient is c/o left ear pain.  She also has vaginal discharge.     The history is provided by the patient.  Otalgia  Location:  Left Behind ear:  Swelling Quality:  Aching Severity:  Moderate Onset quality:  Sudden Duration:  3 days Timing:  Constant Chronicity:  New Relieved by:  Nothing Worsened by:  Nothing   Past Medical History:  Diagnosis Date  . Headache, common migraine    hx migraines age 8-25   History reviewed. No pertinent surgical history. Family History  Problem Relation Age of Onset  . Stroke Father   . Diabetes Father   . Hypertension Father    Social History  Substance Use Topics  . Smoking status: Never Smoker  . Smokeless tobacco: Not on file  . Alcohol use 0.0 oz/week     Comment: rarely, 2 x a week   OB History    No data available     Review of Systems  Constitutional: Negative.   HENT: Positive for ear pain.   Eyes: Negative.   Respiratory: Negative.   Cardiovascular: Negative.   Gastrointestinal: Negative.   Endocrine: Negative.   Genitourinary: Negative.   Musculoskeletal: Negative.   Allergic/Immunologic: Negative.   Neurological: Negative.   Hematological: Negative.   Psychiatric/Behavioral: Negative.     Allergies  Patient has no known allergies.  Home Medications   Prior to Admission medications   Medication Sig Start Date End Date Taking? Authorizing Provider  amoxicillin (AMOXIL) 875 MG tablet Take 1 tablet (875 mg total) by mouth 2 (two) times daily. 12/10/16   Deatra Canter, FNP  ibuprofen (ADVIL,MOTRIN) 400 MG tablet Take 400 mg by mouth every 6 (six) hours as needed. Reported on 10/14/2015    Historical Provider, MD  ipratropium (ATROVENT) 0.06 % nasal spray Place 2 sprays into both nostrils 4  (four) times daily. 12/10/16   Deatra Canter, FNP  metroNIDAZOLE (FLAGYL) 500 MG tablet Take 1 tablet (500 mg total) by mouth 2 (two) times daily with a meal. DO NOT CONSUME ALCOHOL WHILE TAKING THIS MEDICATION. 10/14/15   Elvina Sidle, MD  nitrofurantoin, macrocrystal-monohydrate, (MACROBID) 100 MG capsule Take 1 capsule (100 mg total) by mouth 2 (two) times daily. 10/14/15   Elvina Sidle, MD   Meds Ordered and Administered this Visit  Medications - No data to display  BP 129/73 (BP Location: Right Arm)   Pulse 80   Temp 98.8 F (37.1 C) (Oral)   Resp 14   LMP 11/28/2016   SpO2 98%  No data found.   Physical Exam  Constitutional: She is oriented to person, place, and time. She appears well-developed and well-nourished.  HENT:  Head: Normocephalic and atraumatic.  Right Ear: External ear normal.  Left Ear: External ear normal.  Mouth/Throat: Oropharynx is clear and moist.  Left TM with erythema  Eyes: Conjunctivae and EOM are normal. Pupils are equal, round, and reactive to light.  Neck: Normal range of motion. Neck supple.  Cardiovascular: Normal rate, regular rhythm and normal heart sounds.   Pulmonary/Chest: Effort normal and breath sounds normal.  Abdominal: Soft. Bowel sounds are normal.  Neurological: She is alert and oriented to person, place, and time.  Nursing note and vitals reviewed.   Urgent Care Course     Procedures (including  critical care time)  Labs Review Labs Reviewed  POCT URINALYSIS DIP (DEVICE)  POCT PREGNANCY, URINE  URINE CYTOLOGY ANCILLARY ONLY    Imaging Review No results found.   Visual Acuity Review  Right Eye Distance:   Left Eye Distance:   Bilateral Distance:    Right Eye Near:   Left Eye Near:    Bilateral Near:         MDM   1. Otalgia of left ear   2. Acute serous otitis media of left ear, recurrence not specified   3. Irregular menses    UA Pregnancy - Negative Follow up with PCP for irrigular  menses  Amoxicillin  one po bid x 7 days #14 Atrovent nasal spray   Deatra Canter, FNP 12/10/16 2124

## 2016-12-12 LAB — URINE CYTOLOGY ANCILLARY ONLY
Chlamydia: NEGATIVE
Neisseria Gonorrhea: NEGATIVE
Trichomonas: NEGATIVE

## 2016-12-14 ENCOUNTER — Telehealth: Payer: Self-pay | Admitting: Internal Medicine

## 2016-12-14 LAB — URINE CYTOLOGY ANCILLARY ONLY: Candida vaginitis: NEGATIVE

## 2016-12-14 MED ORDER — METRONIDAZOLE 500 MG PO TABS: 500.0000 mg | ORAL_TABLET | Freq: Two times a day (BID) | ORAL | 0 refills | Status: AC

## 2016-12-14 NOTE — Telephone Encounter (Signed)
Clinical staff, please let patient know that test for gardnerella (bacterial vaginosis) was positive.  Rx metronidazole was sent to the pharmacy of record, [facility] on [address].  Recheck or followup with PCP for further evaluation if symptoms are not improving.  LM

## 2023-09-25 ENCOUNTER — Emergency Department (HOSPITAL_COMMUNITY)
Admission: EM | Admit: 2023-09-25 | Discharge: 2023-09-25 | Discharge status: Against Medical Advice | Payer: BLUE CROSS/BLUE SHIELD | Source: Home / Self Care
